# Patient Record
Sex: Female | Born: 1955
Health system: Southern US, Community
[De-identification: ages and names within clinical notes are randomized; demographics above are authoritative.]

## PROBLEM LIST (undated history)

## (undated) DIAGNOSIS — Z87898 Personal history of other specified conditions: Secondary | ICD-10-CM

## (undated) DIAGNOSIS — J45909 Unspecified asthma, uncomplicated: Secondary | ICD-10-CM

## (undated) DIAGNOSIS — Z8709 Personal history of other diseases of the respiratory system: Secondary | ICD-10-CM

## (undated) DIAGNOSIS — J189 Pneumonia, unspecified organism: Secondary | ICD-10-CM

## (undated) DIAGNOSIS — H9311 Tinnitus, right ear: Secondary | ICD-10-CM

## (undated) DIAGNOSIS — K8010 Calculus of gallbladder with chronic cholecystitis without obstruction: Secondary | ICD-10-CM

## (undated) DIAGNOSIS — I7300 Raynaud's syndrome without gangrene: Secondary | ICD-10-CM

## (undated) DIAGNOSIS — M8580 Other specified disorders of bone density and structure, unspecified site: Secondary | ICD-10-CM

## (undated) DIAGNOSIS — IMO0002 Reserved for concepts with insufficient information to code with codable children: Secondary | ICD-10-CM

## (undated) DIAGNOSIS — I10 Essential (primary) hypertension: Secondary | ICD-10-CM

## (undated) DIAGNOSIS — F329 Major depressive disorder, single episode, unspecified: Secondary | ICD-10-CM

## (undated) DIAGNOSIS — K838 Other specified diseases of biliary tract: Secondary | ICD-10-CM

## (undated) DIAGNOSIS — F32A Depression, unspecified: Secondary | ICD-10-CM

## (undated) DIAGNOSIS — R918 Other nonspecific abnormal finding of lung field: Secondary | ICD-10-CM

## (undated) HISTORY — DX: Depression, unspecified: F32.A

## (undated) HISTORY — DX: Tinnitus, right ear: H93.11

## (undated) HISTORY — DX: Reserved for concepts with insufficient information to code with codable children: IMO0002

## (undated) HISTORY — DX: Essential (primary) hypertension: I10

## (undated) HISTORY — DX: Other nonspecific abnormal finding of lung field: R91.8

## (undated) HISTORY — PX: DILATION AND CURETTAGE OF UTERUS: SHX78

## (undated) HISTORY — DX: Major depressive disorder, single episode, unspecified: F32.9

## (undated) HISTORY — PX: COLONOSCOPY: SHX174

## (undated) HISTORY — PX: CHOLECYSTECTOMY: SHX55

## (undated) HISTORY — DX: Other specified disorders of bone density and structure, unspecified site: M85.80

---

## 1992-05-01 HISTORY — PX: BREAST SURGERY: SHX581

## 1997-09-04 ENCOUNTER — Other Ambulatory Visit: Admission: RE | Admit: 1997-09-04 | Discharge: 1997-09-04 | Payer: Self-pay | Admitting: Obstetrics & Gynecology

## 1998-09-08 ENCOUNTER — Other Ambulatory Visit: Admission: RE | Admit: 1998-09-08 | Discharge: 1998-09-08 | Payer: Self-pay | Admitting: Obstetrics & Gynecology

## 2001-10-30 ENCOUNTER — Other Ambulatory Visit: Admission: RE | Admit: 2001-10-30 | Discharge: 2001-10-30 | Payer: Self-pay | Admitting: Obstetrics & Gynecology

## 2003-08-04 ENCOUNTER — Other Ambulatory Visit: Admission: RE | Admit: 2003-08-04 | Discharge: 2003-08-04 | Payer: Self-pay | Admitting: Obstetrics & Gynecology

## 2005-01-25 ENCOUNTER — Other Ambulatory Visit: Admission: RE | Admit: 2005-01-25 | Discharge: 2005-01-25 | Payer: Self-pay | Admitting: Obstetrics and Gynecology

## 2005-02-21 ENCOUNTER — Encounter (INDEPENDENT_AMBULATORY_CARE_PROVIDER_SITE_OTHER): Payer: Self-pay | Admitting: *Deleted

## 2005-02-21 ENCOUNTER — Ambulatory Visit (HOSPITAL_COMMUNITY): Admission: RE | Admit: 2005-02-21 | Discharge: 2005-02-21 | Payer: Self-pay | Admitting: Obstetrics and Gynecology

## 2009-12-08 ENCOUNTER — Ambulatory Visit (HOSPITAL_BASED_OUTPATIENT_CLINIC_OR_DEPARTMENT_OTHER): Admission: RE | Admit: 2009-12-08 | Discharge: 2009-12-08 | Payer: Self-pay | Admitting: General Surgery

## 2010-09-16 NOTE — Op Note (Signed)
NAME:  Shelby Becker, Shelby Becker NO.:  [id number]   MEDICAL RECORD NO.:  [id number]          PATIENT TYPE:  AMB   LOCATION:  SDC                           FACILITY:  [facility]   PHYSICIAN:  Randye Lobo, M.D.   DATE OF BIRTH:  December 12, 1955   DATE OF PROCEDURE:  02/21/2005  DATE OF DISCHARGE:                                 OPERATIVE REPORT   PREOPERATIVE DIAGNOSES:  1.  Postmenopausal bleeding.  2.  Thickened endometrium.  3.  Cervical stenosis.   POSTOPERATIVE DIAGNOSES:  1.  Postmenopausal bleeding.  2.  Thickened endometrium.  3.  Cervical stenosis.  4.  Asherman syndrome.   PROCEDURE:  Hysteroscopy, fractional dilatation and curettage.   SURGEON:  Conley Simmonds, M.D.   ANESTHESIA:  General endotracheal, paracervical block with 1% lidocaine.   IV FLUIDS:  __________.   ESTIMATED BLOOD LOSS:  Minimal.   URINE OUTPUT:  50 mL prior to procedure.   COMPLICATIONS:  None.   INDICATIONS FOR PROCEDURE:  The patient is a 55 year old gravida 4, para 3-0-  1-2, Caucasian female with an episode of postmenopausal bleeding.  The  patient was seen in the office and had an ultrasound on February 03, 2005,  which documented an endometrial thickness of 0.93 cm.  There was a 1.92 cm  anterior fibroid.  The ovaries were not seen well.  An attempt was made to perform an  endometrial biopsy in the office; however, there was cervical stenosis  encountered and this was not possible.  A plan was made to proceed with a  hysteroscopy, D&C, and possible polypectomy after risks, benefits, and  alternatives were discussed with the patient.   FINDINGS:  Exam under anesthesia revealed a small, midposition uterus.  No  adnexal masses were appreciated.   During the dilation process, cervical stenosis was encountered.  Hysteroscopy did demonstrate some thickened endometrium.  At the uterine  fundus, there appeared to be areas of adhesion between the anterior and the  posterior uterine wall  suggesting Asherman syndrome.   A small amount of endometrial and endocervical curettings were obtained.   SPECIMENS:  Endocervical curettings and endometrial curettings were sent to  pathology separately.   PROCEDURE:  The patient was reidentified in the preoperative hold area.  She  was taken down to the operating room, where general endotracheal anesthesia  was induced.  The patient was placed in the dorsal lithotomy position and  the vagina and perineum were then sterilely prepped and the bladder was  catheterized of urine.  The patient was then sterilely draped.   A speculum was placed inside the vagina and a single-tooth tenaculum was  placed on the anterior cervical lip.  An attempt was made to pass the  uterine sound; however, this was not possible.  Small Hegar dilators were  then used to dilate the cervix initially so that the [facility] dilators could be  used successfully up to a [employer] dilator.  The hysteroscope was then  inserted and under the continuous infusion of sorbitol solution, the  findings were as noted above.  The hysteroscope was  then withdrawn and  endocervical curettings were performed with a Kevorkian curette followed by  endometrial curettings with the serrated curette.  The specimens were sent  to pathology.  The hysteroscope was inserted one final time and the  endometrial cavity had a more uniform appearance at this time.  No specific  remaining lesions were identified.  The tubal ostia could not be visualized  well on both sides.  The hysteroscope was withdrawn and the vaginal instruments were removed.  Hemostasis was assured.   The patient was awakened and extubated and sent to the recovery room in  stable and awake condition.  There were no complications to this procedure.  All needle, instrument, and  sponge counts were correct.      Randye Lobo, M.D.  Electronically Signed     BES/MEDQ  D:  02/21/2005  T:  02/21/2005  Job:  [id number]

## 2010-10-19 ENCOUNTER — Other Ambulatory Visit: Payer: Self-pay | Admitting: Obstetrics and Gynecology

## 2013-02-13 ENCOUNTER — Other Ambulatory Visit: Payer: Self-pay | Admitting: Dermatology

## 2013-04-08 ENCOUNTER — Other Ambulatory Visit: Payer: Self-pay

## 2013-10-02 ENCOUNTER — Telehealth: Payer: Self-pay | Admitting: Obstetrics and Gynecology

## 2013-10-02 NOTE — Telephone Encounter (Signed)
Trying to confirm patients appt home number is invalid and cell the vm was not set up yet

## 2013-10-02 NOTE — Telephone Encounter (Signed)
Patient confirmed appoitment

## 2013-10-09 ENCOUNTER — Ambulatory Visit (INDEPENDENT_AMBULATORY_CARE_PROVIDER_SITE_OTHER): Payer: BC Managed Care – PPO | Admitting: Obstetrics and Gynecology

## 2013-10-09 ENCOUNTER — Encounter: Payer: Self-pay | Admitting: Obstetrics and Gynecology

## 2013-10-09 VITALS — BP 120/82 | HR 70 | Ht 67.0 in | Wt 194.6 lb

## 2013-10-09 DIAGNOSIS — Z01419 Encounter for gynecological examination (general) (routine) without abnormal findings: Secondary | ICD-10-CM

## 2013-10-09 MED ORDER — ESTRADIOL 0.1 MG/GM VA CREA: TOPICAL_CREAM | VAGINAL | Status: DC

## 2013-10-09 NOTE — Patient Instructions (Signed)

## 2013-10-09 NOTE — Progress Notes (Signed)
Patient ID: Shelby Becker, female   DOB: 1956-01-25, 58 y.o.   MRN: [id number] GYNECOLOGY VISIT  PCP:   Hulan Fess, MD  Referring provider:   HPI: 58 y.o.   Single  Caucasian  female   [phone] with Patient's last menstrual period was 05/01/2004.   here for  AEX.    Needs refill on Estrace cream.  Uses rarely.   Has learned that dietary choices affect her urination and pain with urination.  No dysuria today.  Some urinary frequency which irritates her when she wants to go on long walk.  Voids often for convenience.   Children out of the house.  Husband has retired.   Goal to loose 15 pounds. Has already lost 5 pounds. Does 4 mile walks.  Hairline fracture of left foot.   Hgb:   PCP Urine:  PCP  GYNECOLOGIC HISTORY: Patient's last menstrual period was 05/01/2004. Sexually active:  yes Partner preference: female Contraception:  Postmenopausal  Menopausal hormone therapy: Estrace vaginal cream prn DES exposure:  no  Blood transfusions:  no  Sexually transmitted diseases: no   GYN procedures and prior surgeries:  C-section, benign breast biopsy on left breast. Last mammogram:  09/2012 GLO:VFIEP               Last pap and high risk HPV testing: 09/2011 PIR:JJOACZ of HPV testing.   History of abnormal pap smear: 1986 had colposcopy/cryotherapy to cervix for abnormal pap.  Paps normal since.    OB History   Grav Para Term Preterm Abortions TAB SAB Ect Mult Living   3 3 3       2        LIFESTYLE: Exercise:  walking             Tobacco:  no Alcohol:    2-3 drinks per week--usually wine Drug use:  no  OTHER HEALTH MAINTENANCE: Tetanus/TDap:  Up to date with PCP Gardisil:             n/a Influenza:          01/2013 Zostavax:           23-Aug-2013 with PCP  Bone density:    08-24-2010 at [facility] Colonoscopy:   2008-08-23 wnl with Dr. Cristina Gong.  Next colonoscopy due 2018-08-24.  Cholesterol check: 08/2013 wnl  Family History  Problem Relation Age of Onset  . Thyroid disease  Mother     hypothyroid  . Hypertension Father   . Thyroid disease Sister     Hashimotos    There are no active problems to display for this patient.  Past Medical History  Diagnosis Date  . Depression     Tx'd in August 23, 1989 after a death of an infant  . Dyspareunia   . Hypertension     Past Surgical History  Procedure Laterality Date  . Breast surgery  1994    benign cyst removed left breast  . Cesarean section  1991    ALLERGIES: Review of patient's allergies indicates no known allergies.  Current Outpatient Prescriptions  Medication Sig Dispense Refill  . CALCIUM PO Take 400 mg by mouth daily.      . Cholecalciferol (VITAMIN D-3) 1000 UNITS CAPS Take 1,000 Units by mouth daily.      Marland Kitchen estradiol (ESTRACE VAGINAL) 0.1 MG/GM vaginal cream Place 1 Applicatorful vaginally as needed.      Marland Kitchen losartan (COZAAR) 50 MG tablet Take 1 tablet by mouth daily.      . meloxicam (MOBIC)  15 MG tablet Take 15 mg by mouth as needed.      . vitamin B-12 (CYANOCOBALAMIN) 1000 MCG tablet Take 1,000 mcg by mouth once a week.       No current facility-administered medications for this visit.     ROS:  Pertinent items are noted in HPI.  SOCIAL HISTORY:  Married.   PHYSICAL EXAMINATION:    BP 120/82  Pulse 70  Ht 5\' 7"  (1.702 m)  Wt 194 lb 9.6 oz (88.27 kg)  BMI 30.47 kg/m2  LMP 05/01/2004   Wt Readings from Last 3 Encounters:  10/09/13 194 lb 9.6 oz (88.27 kg)     Ht Readings from Last 3 Encounters:  10/09/13 5\' 7"  (1.702 m)    General appearance: alert, cooperative and appears stated age Head: Normocephalic, without obvious abnormality, atraumatic Neck: no adenopathy, supple, symmetrical, trachea midline and thyroid not enlarged, symmetric, no tenderness/mass/nodules Lungs: clear to auscultation bilaterally Breasts: Inspection negative, No nipple retraction or dimpling, No nipple discharge or bleeding, No axillary or supraclavicular adenopathy, Normal to palpation without dominant  masses Heart: regular rate and rhythm Abdomen: soft, non-tender; no masses,  no organomegaly Extremities: extremities normal, atraumatic, no cyanosis or edema Skin: Skin color, texture, turgor normal. No rashes or lesions Lymph nodes: Cervical, supraclavicular, and axillary nodes normal. No abnormal inguinal nodes palpated Neurologic: Grossly normal  Pelvic: External genitalia:  no lesions              Urethra:  normal appearing urethra with no masses, tenderness or lesions              Bartholins and Skenes: normal                 Vagina: normal appearing vagina with normal color and discharge, no lesions              Cervix: normal appearance              Pap and high risk HPV testing done: yes.            Bimanual Exam:  Uterus:  uterus is normal size, shape, consistency and nontender                                      Adnexa: normal adnexa in size, nontender and no masses                                      Rectovaginal: Confirms                                      Anus:  normal sphincter tone, no lesions  ASSESSMENT  Normal gynecologic exam. Remote history of cervical dysplasia.  Vaginal atrophy.   PLAN  Mammogram recommended yearly.  She will call [facility].  Pap smear and high risk HPV testing performed.  Estrace cream 1/2 gm pv at hs twice a week prn.  See Epic orders. Discussed risks of breat CA, MI, stroke , DVT, PE.  Counseled on self breast exam, Calcium and vitamin D intake, exercise. Return annually or prn   An After Visit Summary was printed and given to the patient.

## 2013-10-10 NOTE — Addendum Note (Signed)
Addended by: Tacy Learn, BROOK E on: 10/10/2013 06:12 PM   Modules accepted: Orders

## 2013-10-16 LAB — IPS PAP TEST WITH HPV

## 2013-11-24 ENCOUNTER — Other Ambulatory Visit: Payer: Self-pay | Admitting: Dermatology

## 2013-12-26 ENCOUNTER — Encounter: Payer: Self-pay | Admitting: Obstetrics and Gynecology

## 2014-03-02 ENCOUNTER — Encounter: Payer: Self-pay | Admitting: Obstetrics and Gynecology

## 2014-08-10 ENCOUNTER — Other Ambulatory Visit: Payer: Self-pay | Admitting: Dermatology

## 2014-08-17 ENCOUNTER — Telehealth: Payer: Self-pay | Admitting: Obstetrics and Gynecology

## 2014-08-17 NOTE — Telephone Encounter (Signed)
Left patient a message to call back to reschedule a future appointment that was cancelled by the provider. °

## 2014-10-16 ENCOUNTER — Ambulatory Visit: Payer: Self-pay | Admitting: Obstetrics and Gynecology

## 2014-10-30 ENCOUNTER — Ambulatory Visit: Payer: Self-pay | Admitting: Obstetrics and Gynecology

## 2014-11-04 ENCOUNTER — Ambulatory Visit (INDEPENDENT_AMBULATORY_CARE_PROVIDER_SITE_OTHER): Payer: BC Managed Care – PPO | Admitting: Obstetrics and Gynecology

## 2014-11-04 ENCOUNTER — Encounter: Payer: Self-pay | Admitting: Obstetrics and Gynecology

## 2014-11-04 VITALS — BP 118/78 | HR 66 | Resp 18 | Ht 67.5 in | Wt 195.2 lb

## 2014-11-04 DIAGNOSIS — Z0000 Encounter for general adult medical examination without abnormal findings: Secondary | ICD-10-CM | POA: Diagnosis not present

## 2014-11-04 DIAGNOSIS — Z01419 Encounter for gynecological examination (general) (routine) without abnormal findings: Secondary | ICD-10-CM

## 2014-11-04 LAB — POCT URINALYSIS DIPSTICK
Bilirubin, UA: NEGATIVE
Blood, UA: NEGATIVE
Glucose, UA: NEGATIVE
Ketones, UA: NEGATIVE
Leukocytes, UA: NEGATIVE
Nitrite, UA: NEGATIVE
Protein, UA: NEGATIVE
Urobilinogen, UA: NEGATIVE
pH, UA: 5

## 2014-11-04 NOTE — Patient Instructions (Signed)

## 2014-11-04 NOTE — Progress Notes (Signed)
Patient ID: Shelby Becker, female   DOB: 16-Sep-1955, 59 y.o.   MRN: [id number] 59 y.o. [id number] Single Caucasian female here for annual exam.    Has Rx for vaginal Estrace cream.  Not using lately.  Not sexually active and uses samples from a urology friend.   Doing slow and gradual weight loss.  Having right bicep problems.  May need future surgery.   Husband has retired now.  Parents are 39 and living on their own. Daughter going to grad school.  PCP:  Hulan Fess, MD   Labs with PCP. Had low GFR. Patient increased her antiHTN and she increased her fluid intact.  Some dizziness and fatigue.   Patient's last menstrual period was 05/01/2004.          Sexually active: No.  The current method of family planning is post menopausal status.    Exercising: Yes.    walking and jogging. Smoker:  former  Health Maintenance: Pap:  10-09-13 wnl:neg HR HPV History of abnormal Pap:  Yes, Hx colposcopy and cryotherapy to cervix 1986. MMG:  11-08-13 Density Cat.B/Neg:[facility].  Has an appointment for next week. Colonoscopy:  2008/08/19 normal with Dr. Cristina Gong.  Next due 08-20-18. BMD:   08/20/2010  Result  Normal:[facility] TDaP:  Up to date with PCP Screening Labs:  Hb today: PCP, Urine today: Neg   reports that she quit smoking about 30 years ago. She does not have any smokeless tobacco history on file. She reports that she drinks about 0.6 oz of alcohol per week. She reports that she does not use illicit drugs.  Past Medical History  Diagnosis Date  . Depression     Tx'd in 08/19/89 after a death of an infant  . Dyspareunia   . Hypertension     Past Surgical History  Procedure Laterality Date  . Breast surgery  1994    benign cyst removed left breast  . Cesarean section  08-19-1989    Current Outpatient Prescriptions  Medication Sig Dispense Refill  . CALCIUM PO Take 400 mg by mouth daily.    . Cholecalciferol (VITAMIN D-3) 1000 UNITS CAPS Take 1,000 Units by mouth daily. Patient taking 2000 units  daily    . ergocalciferol (VITAMIN D2) 50000 UNITS capsule Take 50,000 Units by mouth once a week.    . estradiol (ESTRACE VAGINAL) 0.1 MG/GM vaginal cream Use 1/2 gram per vagina twice a week as needed. 42.5 g 3  . losartan (COZAAR) 50 MG tablet Take 1 tablet by mouth daily.     No current facility-administered medications for this visit.    Family History  Problem Relation Age of Onset  . Thyroid disease Mother     hypothyroid  . Hypertension Father   . Thyroid disease Sister     Hashimotos    ROS:  Pertinent items are noted in HPI.  Otherwise, a comprehensive ROS was negative.  Exam:   BP 118/78 mmHg  Pulse 66  Resp 18  Ht 5' 7.5" (1.715 m)  Wt 195 lb 3.2 oz (88.542 kg)  BMI 30.10 kg/m2  LMP 05/01/2004    General appearance: alert, cooperative and appears stated age Head: Normocephalic, without obvious abnormality, atraumatic Neck: no adenopathy, supple, symmetrical, trachea midline and thyroid normal to inspection and palpation Lungs: clear to auscultation bilaterally Breasts: normal appearance, no masses or tenderness, Inspection negative, No nipple retraction or dimpling, No nipple discharge or bleeding, No axillary or supraclavicular adenopathy, Radial scar of right breast. Heart: regular rate  and rhythm Abdomen: soft, non-tender; bowel sounds normal; no masses,  no organomegaly Extremities: extremities normal, atraumatic, no cyanosis or edema Skin: Skin color, texture, turgor normal. No rashes or lesions Lymph nodes: Cervical, supraclavicular, and axillary nodes normal. No abnormal inguinal nodes palpated Neurologic: Grossly normal  Pelvic: External genitalia:  no lesions.  Pigmentation of right labia minora.                Urethra:  normal appearing urethra with no masses, tenderness or lesions              Bartholins and Skenes: normal                 Vagina: normal appearing vagina with normal color and discharge, no lesions.  Atrophy of the vaginal mucosa.                Cervix: no lesions              Pap taken: No. Bimanual Exam:  Uterus:  normal size, contour, position, consistency, mobility, non-tender              Adnexa: normal adnexa and no mass, fullness, tenderness              Rectovaginal: Yes.  .  Confirms.              Anus:  normal sphincter tone, no lesions  Chaperone was present for exam.  Assessment:   Well woman visit with normal exam. Periodic use of local vaginal estrogen therapy.   Plan: Yearly mammogram recommended after age 66.  Recommended self breast exam.  Pap and HR HPV as above. Discussed Calcium, Vitamin D, regular exercise program including cardiovascular and weight bearing exercise. Labs performed.  No..   See orders. Refills given on medications.  No..    Discussed use of vaginal estrogen cream 1/2 gram pv twice weekly.  Discussed risks of breast CA, DVT, PE, MI and stroke.  Follow up annually and prn.     After visit summary provided.

## 2014-12-19 ENCOUNTER — Encounter (HOSPITAL_COMMUNITY): Payer: Self-pay | Admitting: *Deleted

## 2014-12-19 ENCOUNTER — Emergency Department (HOSPITAL_COMMUNITY): Payer: BC Managed Care – PPO

## 2014-12-19 ENCOUNTER — Emergency Department (HOSPITAL_COMMUNITY)
Admission: EM | Admit: 2014-12-19 | Discharge: 2014-12-19 | Disposition: A | Discharge status: Home / Self Care | Payer: BC Managed Care – PPO | Attending: Emergency Medicine | Admitting: Emergency Medicine

## 2014-12-19 DIAGNOSIS — Z87448 Personal history of other diseases of urinary system: Secondary | ICD-10-CM | POA: Diagnosis not present

## 2014-12-19 DIAGNOSIS — Z79899 Other long term (current) drug therapy: Secondary | ICD-10-CM | POA: Insufficient documentation

## 2014-12-19 DIAGNOSIS — R1011 Right upper quadrant pain: Secondary | ICD-10-CM | POA: Diagnosis present

## 2014-12-19 DIAGNOSIS — I10 Essential (primary) hypertension: Secondary | ICD-10-CM | POA: Diagnosis not present

## 2014-12-19 DIAGNOSIS — Z87891 Personal history of nicotine dependence: Secondary | ICD-10-CM | POA: Diagnosis not present

## 2014-12-19 DIAGNOSIS — K8070 Calculus of gallbladder and bile duct without cholecystitis without obstruction: Secondary | ICD-10-CM | POA: Diagnosis not present

## 2014-12-19 DIAGNOSIS — Z8659 Personal history of other mental and behavioral disorders: Secondary | ICD-10-CM | POA: Diagnosis not present

## 2014-12-19 LAB — URINALYSIS, ROUTINE W REFLEX MICROSCOPIC
Bilirubin Urine: NEGATIVE
Glucose, UA: NEGATIVE mg/dL
Hgb urine dipstick: NEGATIVE
Ketones, ur: NEGATIVE mg/dL
Leukocytes, UA: NEGATIVE
Nitrite: NEGATIVE
Protein, ur: NEGATIVE mg/dL
Specific Gravity, Urine: 1.013 (ref 1.005–1.030)
Urobilinogen, UA: 0.2 mg/dL (ref 0.0–1.0)
pH: 5 (ref 5.0–8.0)

## 2014-12-19 LAB — COMPREHENSIVE METABOLIC PANEL
ALT: 24 U/L (ref 14–54)
AST: 24 U/L (ref 15–41)
Albumin: 4.2 g/dL (ref 3.5–5.0)
Alkaline Phosphatase: 95 U/L (ref 38–126)
Anion gap: 10 (ref 5–15)
BUN: 16 mg/dL (ref 6–20)
CO2: 22 mmol/L (ref 22–32)
Calcium: 9.6 mg/dL (ref 8.9–10.3)
Chloride: 109 mmol/L (ref 101–111)
Creatinine, Ser: 1.08 mg/dL — ABNORMAL HIGH (ref 0.44–1.00)
GFR calc Af Amer: >60 mL/min (ref >60)
GFR calc non Af Amer: 55 mL/min — ABNORMAL LOW (ref >60)
Glucose, Bld: 129 mg/dL — ABNORMAL HIGH (ref 65–99)
Potassium: 4 mmol/L (ref 3.5–5.1)
Sodium: 141 mmol/L (ref 135–145)
Total Bilirubin: 0.7 mg/dL (ref 0.3–1.2)
Total Protein: 6.9 g/dL (ref 6.5–8.1)

## 2014-12-19 LAB — CBC
HCT: 43.4 % (ref 36.0–46.0)
Hemoglobin: 14.4 g/dL (ref 12.0–15.0)
MCH: 30.4 pg (ref 26.0–34.0)
MCHC: 33.2 g/dL (ref 30.0–36.0)
MCV: 91.8 fL (ref 78.0–100.0)
Platelets: 185 10*3/uL (ref 150–400)
RBC: 4.73 MIL/uL (ref 3.87–5.11)
RDW: 14 % (ref 11.5–15.5)
WBC: 7.6 10*3/uL (ref 4.0–10.5)

## 2014-12-19 LAB — LIPASE, BLOOD: Lipase: 22 U/L (ref 22–51)

## 2014-12-19 LAB — TROPONIN I: Troponin I: <0.03 ng/mL (ref <0.031)

## 2014-12-19 MED ORDER — HYDROCODONE-ACETAMINOPHEN 5-325 MG PO TABS: 1.0000 | ORAL_TABLET | Freq: Four times a day (QID) | ORAL | Status: DC | PRN

## 2014-12-19 MED ORDER — MORPHINE SULFATE (PF) 4 MG/ML IV SOLN
4.0000 mg | Freq: Once | INTRAVENOUS | Status: AC
  Administered 2014-12-19: 4 mg via INTRAVENOUS
  Filled 2014-12-19: qty 1

## 2014-12-19 MED ORDER — ONDANSETRON HCL 4 MG/2ML IJ SOLN
4.0000 mg | Freq: Once | INTRAMUSCULAR | Status: AC
  Administered 2014-12-19: 4 mg via INTRAVENOUS
  Filled 2014-12-19: qty 2

## 2014-12-19 NOTE — Discharge Instructions (Signed)
Biliary Colic  °Biliary colic is a steady or irregular pain in the upper abdomen. It is usually under the right side of the rib cage. It happens when gallstones interfere with the normal flow of bile from the gallbladder. Bile is a liquid that helps to digest fats. Bile is made in the liver and stored in the gallbladder. When you eat a meal, bile passes from the gallbladder through the cystic duct and the common bile duct into the small intestine. There, it mixes with partially digested food. If a gallstone blocks either of these ducts, the normal flow of bile is blocked. The muscle cells in the bile duct contract forcefully to try to move the stone. This causes the pain of biliary colic.  °SYMPTOMS  °· A person with biliary colic usually complains of pain in the upper abdomen. This pain can be: °¨ In the center of the upper abdomen just below the breastbone. °¨ In the upper-right part of the abdomen, near the gallbladder and liver. °¨ Spread back toward the right shoulder blade. °· Nausea and vomiting. °· The pain usually occurs after eating. °· Biliary colic is usually triggered by the digestive system's demand for bile. The demand for bile is high after fatty meals. Symptoms can also occur when a person who has been fasting suddenly eats a very large meal. Most episodes of biliary colic pass after 1 to 5 hours. After the most intense pain passes, your abdomen may continue to ache mildly for about 24 hours. °DIAGNOSIS  °After you describe your symptoms, your caregiver will perform a physical exam. He or she will pay attention to the upper right portion of your belly (abdomen). This is the area of your liver and gallbladder. An ultrasound will help your caregiver look for gallstones. Specialized scans of the gallbladder may also be done. Blood tests may be done, especially if you have fever or if your pain persists. °PREVENTION  °Biliary colic can be prevented by controlling the risk factors for gallstones. Some of  these risk factors, such as heredity, increasing age, and pregnancy are a normal part of life. Obesity and a high-fat diet are risk factors you can change through a healthy lifestyle. Women going through menopause who take hormone replacement therapy (estrogen) are also more likely to develop biliary colic. °TREATMENT  °· Pain medication may be prescribed. °· You may be encouraged to eat a fat-free diet. °· If the first episode of biliary colic is severe, or episodes of colic keep retuning, surgery to remove the gallbladder (cholecystectomy) is usually recommended. This procedure can be done through small incisions using an instrument called a laparoscope. The procedure often requires a brief stay in the hospital. Some people can leave the hospital the same day. It is the most widely used treatment in people troubled by painful gallstones. It is effective and safe, with no complications in more than 90% of cases. °· If surgery cannot be done, medication that dissolves gallstones may be used. This medication is expensive and can take months or years to work. Only small stones will dissolve. °· Rarely, medication to dissolve gallstones is combined with a procedure called shock-wave lithotripsy. This procedure uses carefully aimed shock waves to break up gallstones. In many people treated with this procedure, gallstones form again within a few years. °PROGNOSIS  °If gallstones block your cystic duct or common bile duct, you are at risk for repeated episodes of biliary colic. There is also a 25% chance that you will develop   a gallbladder infection(acute cholecystitis), or some other complication of gallstones within 10 to 20 years. If you have surgery, schedule it at a time that is convenient for you and at a time when you are not sick. °HOME CARE INSTRUCTIONS  °· Drink plenty of clear fluids. °· Avoid fatty, greasy or fried foods, or any foods that make your pain worse. °· Take medications as directed. °SEEK MEDICAL  CARE IF:  °· You develop a fever over 100.5° F (38.1° C). °· Your pain gets worse over time. °· You develop nausea that prevents you from eating and drinking. °· You develop vomiting. °SEEK IMMEDIATE MEDICAL CARE IF:  °· You have continuous or severe belly (abdominal) pain which is not relieved with medications. °· You develop nausea and vomiting which is not relieved with medications. °· You have symptoms of biliary colic and you suddenly develop a fever and shaking chills. This may signal cholecystitis. Call your caregiver immediately. °· You develop a yellow color to your skin or the white part of your eyes (jaundice). °Document Released: 09/18/2005 Document Revised: 07/10/2011 Document Reviewed: 11/28/2007 °Exit[employer]® Patient Information ©2015 [employer]. This information is not intended to replace advice given to you by your health care provider. Make sure you discuss any questions you have with your health care provider. ° °

## 2014-12-19 NOTE — ED Notes (Signed)
Nurse getting labs 

## 2014-12-19 NOTE — ED Notes (Signed)
Pt cannot use restroom at this time, aware specimen is needed. 

## 2014-12-19 NOTE — ED Provider Notes (Signed)
CSN: [id number]     Arrival date & time 12/19/14  1524 History   First MD Initiated Contact with Patient 12/19/14 1545     Chief Complaint  Patient presents with  . Abdominal Pain     (Consider location/radiation/quality/duration/timing/severity/associated sxs/prior Treatment) HPI Comments: Pt comes in with c/o epigastric pain for the last 3 hours. She states that after 2 dose of tums she got some relief. Denies vomiting and fever. Has nausea. Has watery stool yesterday. She states that she had a similar episode about a month ago but it resolved so she was not seen. She states that she had an argument with family member prior to episode. She states that she is still having some discomfort.   The history is provided by the patient. No language interpreter was used.    Past Medical History  Diagnosis Date  . Depression     Tx'd in Jul 31, 1989 after a death of an infant  . Dyspareunia   . Hypertension    Past Surgical History  Procedure Laterality Date  . Breast surgery  1994    benign cyst removed left breast  . Cesarean section  07-31-89   Family History  Problem Relation Age of Onset  . Thyroid disease Mother     hypothyroid  . Hypertension Father   . Thyroid disease Sister     [place]   Social History  Substance Use Topics  . Smoking status: Former Smoker    Quit date: 05/01/1984  . Smokeless tobacco: None  . Alcohol Use: 0.6 oz/week    1 Standard drinks or equivalent per week     Comment: once weekly   OB History    Gravida Para Term Preterm AB TAB SAB Ectopic Multiple Living   3 3 3       2      Review of Systems  All other systems reviewed and are negative.     Allergies  Review of patient's allergies indicates no known allergies.  Home Medications   Prior to Admission medications   Medication Sig Start Date End Date Taking? Authorizing Provider  CALCIUM PO Take 400 mg by mouth daily.    Historical Provider, MD  Cholecalciferol (VITAMIN D-3) 1000 UNITS CAPS  Take 1,000 Units by mouth daily. Patient taking 2000 units daily    Historical Provider, MD  ergocalciferol (VITAMIN D2) 50000 UNITS capsule Take 50,000 Units by mouth once a week.    Historical Provider, MD  estradiol (ESTRACE VAGINAL) 0.1 MG/GM vaginal cream Use 1/2 gram per vagina twice a week as needed. 10/09/13   Brook Oletta Lamas, MD  losartan (COZAAR) 50 MG tablet Take 1 tablet by mouth daily. 09/03/13   Historical Provider, MD   BP 133/83 mmHg  Pulse 76  Temp(Src) 98.7 F (37.1 C) (Oral)  Resp 18  SpO2 94%  LMP 05/01/2004 Physical Exam  Constitutional: She is oriented to person, place, and time. She appears well-developed and well-nourished.  HENT:  Head: Normocephalic and atraumatic.  Eyes: Conjunctivae and EOM are normal.  Cardiovascular: Normal rate and regular rhythm.   Pulmonary/Chest: Effort normal and breath sounds normal.  Abdominal: Soft. Bowel sounds are normal. There is tenderness in the right upper quadrant.  Musculoskeletal: Normal range of motion.  Neurological: She is alert and oriented to person, place, and time.  Skin: Skin is warm and dry.  Psychiatric: She has a normal mood and affect.  Nursing note and vitals reviewed.   ED Course  Procedures (including  critical care time) Labs Review Labs Reviewed  COMPREHENSIVE METABOLIC PANEL - Abnormal; Notable for the following:    Glucose, Bld 129 (*)    Creatinine, Ser 1.08 (*)    GFR calc non Af Amer 55 (*)    All other components within normal limits  URINALYSIS, ROUTINE W REFLEX MICROSCOPIC (NOT AT [facility]) - Abnormal; Notable for the following:    APPearance CLOUDY (*)    All other components within normal limits  LIPASE, BLOOD  CBC  TROPONIN I    Imaging Review Dg Chest 2 View  12/19/2014   CLINICAL DATA:  Right upper quadrant pain, tenderness, and nausea for 1 day.  EXAM: CHEST  2 VIEW  COMPARISON:  None.  FINDINGS: The heart size and mediastinal contours are within normal limits. Both lungs are  clear. The visualized skeletal structures are unremarkable.  IMPRESSION: No active cardiopulmonary disease.   Electronically Signed   By: Earle Gell M.D.   On: 12/19/2014 16:31   I have personally reviewed and evaluated these images and lab results as part of my medical decision-making.  ED ECG REPORT   Date: 12/19/2014  Rate: 64  Rhythm: normal sinus rhythm  QRS Axis: normal  Intervals: normal  ST/T Wave abnormalities: normal  Conduction Disutrbances:none  Narrative Interpretation:   Old EKG Reviewed: none available  I have personally reviewed the EKG tracing and agree with the computerized printout as noted.   MDM   Final diagnoses:  Calculus of gallbladder and bile duct without cholecystitis or obstruction    Pt is comfortable at this time. Pt is okay to follow up with surgery. Doubt cardiac in nature. Will send home with hydrocodone for pain    Glendell Docker, NP 12/19/14 1841  Varney Biles, MD 12/21/14 [phone]

## 2014-12-19 NOTE — ED Notes (Signed)
Pt reports waxing and waning upper abd pain x3 hours. Sudden onset. Pain worse when lying flat. +nausea, no vomiting. One loose, watery BM yesterday, no associated symptoms then. Denies fever/chills/diaphoresis.

## 2015-01-13 ENCOUNTER — Other Ambulatory Visit: Payer: Self-pay | Admitting: Surgery

## 2015-01-13 NOTE — H&P (Signed)
Shelby Becker 01/13/2015 10:02 AM Location: [facility] Patient #: [id number] DOB: Mar 21, 1956 Married / Language: English / Race: White Female History of Present Illness Adin Hector MD; 01/13/2015 1:27 PM) The patient is a 59 year old female who presents for evaluation of gall stones. Patient sent by Glendell Docker, NP with  emergency Department for concerns of epigastric pain and gallstones. Probable cholecystitis. Movements intermittent attacks of epigastric abdominal pain. Bloating. Some nausea as well. Radiation to the back. Happening in the summertime. Had an intense attack on August 20. Lasted 5 hours Went to emergency room. No cardiac etiology. EKG okay. LFTs and chemistries okay. Ultrasound did show gallstones but no cholecystitis. Pain improved with nausea and pain medicines. Surgical consultation recommended.  The patient comes in today with her husband. She describes a few attacks of sharp sudden upper abdominal pain. One lasted 2 hours and eventually improved. The second one lasted 5 hours. That's when she went to the emergency room. Some question history of heartburn in the past. Required to takes Nexium for a few weeks. Then it resolved. No issues for the past decade. With last attack she did tried some Tums and Rolaids without any help. Very bloated but cannot belch or vomit. Some nausea as well. Some radiation to her back. Seemed very central. Not particularly one side. She does get colonoscopies by Dr. Cristina Gong, [facility] - normal. She exercises regularly and has intentionally lost about 20 pounds in the past few months. She had a C-section but no other abdominal surgeries. No history of Crohn's, inflammatory bowel disease, ulcerative colitis, irritable bowel, hepatitis, heavy drinking, gastritis, ulcers, kidney stones, kidney issues. She recalls being told that her creatinine was mildly elevated. She's going to see a nephrologist for  this. Creatinine 1.08. She wonders if it's related to her exercising. PACS Images Show images for US Abdomen Complete <[email or web address]> Study Result CLINICAL DATA: Epigastric and right upper quadrant pain since noon today EXAM: ULTRASOUND ABDOMEN COMPLETE COMPARISON: None. FINDINGS: Gallbladder: Gallstones identified within the gallbladder. No wall thickening visualized. No sonographic Murphy sign noted. Common bile duct: Diameter: 4 mm Liver: No focal lesion identified. Within normal limits in parenchymal echogenicity. IVC: No abnormality visualized. Pancreas: Visualized portion unremarkable. Spleen: Size and appearance within normal limits. Right Kidney: Length: 10 cm. Echogenicity within normal limits. No mass or hydronephrosis visualized. Left Kidney: Length: 10.4 cm. Echogenicity within normal limits. No mass or hydronephrosis visualized. Abdominal aorta: No aneurysm visualized. Other findings: None. IMPRESSION: Cholelithiasis without sonographic evidence of acute cholecystitis. Electronically Signed By: Abelardo Diesel M.D. On: 12/19/2014 17:20 Other Problems Elbert Ewings, CMA; 01/13/2015 10:02 AM) Cholelithiasis Gastroesophageal Reflux Disease High blood pressure  Past Surgical History Elbert Ewings, CMA; 01/13/2015 10:02 AM) Breast Biopsy Right. Cesarean Section - 1  Diagnostic Studies History Elbert Ewings, CMA; 01/13/2015 10:02 AM) Colonoscopy 1-5 years ago Mammogram within last year Pap Smear 1-5 years ago  Allergies Elbert Ewings, CMA; 01/13/2015 10:03 AM) No Known Drug Allergies 01/13/2015  Medication History Elbert Ewings, CMA; 01/13/2015 10:04 AM) Hydrocodone-Acetaminophen (5-325MG  Tablet, Oral as needed) Active. Losartan Potassium (100MG  Tablet, Oral) Active. EpiPen 2-Pak (0.3MG /0.3ML Soln Auto-inj, Injection) Active. Vitamin D (Ergocalciferol) (50000UNIT Capsule, Oral) Active. Emollient (External) Active. Medications  Reconciled  Social History Elbert Ewings, CMA; 01/13/2015 10:02 AM) Alcohol use Occasional alcohol use. Caffeine use Coffee. No drug use Tobacco use Former smoker.  Family History Elbert Ewings, [place]; 01/13/2015 10:02 AM) Arthritis Father. Heart Disease Sister. Heart disease in female family member before age  33 Hypertension Father. Melanoma Father. Respiratory Condition Father. Thyroid problems Mother.  Pregnancy / Birth History Elbert Ewings, CMA; 01/13/2015 10:02 AM) Age at menarche 50 years. Age of menopause 62-55 Gravida 3 Maternal age 22-25 Para 3     Review of Systems Elbert Ewings CMA; 01/13/2015 10:02 AM) General Not Present- Appetite Loss, Chills, Fatigue, Fever, Night Sweats, Weight Gain and Weight Loss. Skin Not Present- Change in Wart/Mole, Dryness, Hives, Jaundice, New Lesions, Non-Healing Wounds, Rash and Ulcer. HEENT Present- Wears glasses/contact lenses. Not Present- Earache, Hearing Loss, Hoarseness, Nose Bleed, Oral Ulcers, Ringing in the Ears, Seasonal Allergies, Sinus Pain, Sore Throat, Visual Disturbances and Yellow Eyes. Breast Not Present- Breast Mass, Breast Pain, Nipple Discharge and Skin Changes. Cardiovascular Not Present- Chest Pain, Difficulty Breathing Lying Down, Leg Cramps, Palpitations, Rapid Heart Rate, Shortness of Breath and Swelling of Extremities. Gastrointestinal Not Present- Abdominal Pain, Bloating, Bloody Stool, Change in Bowel Habits, Chronic diarrhea, Constipation, Difficulty Swallowing, Excessive gas, Gets full quickly at meals, Hemorrhoids, Indigestion, Nausea, Rectal Pain and Vomiting. Female Genitourinary Not Present- Frequency, Nocturia, Painful Urination, Pelvic Pain and Urgency. Musculoskeletal Not Present- Back Pain, Joint Pain, Joint Stiffness, Muscle Pain, Muscle Weakness and Swelling of Extremities. Neurological Not Present- Decreased Memory, Fainting, Headaches, Numbness, Seizures, Tingling, Tremor, Trouble walking  and Weakness. Psychiatric Not Present- Anxiety, Bipolar, Change in Sleep Pattern, Depression, Fearful and Frequent crying. Endocrine Not Present- Cold Intolerance, Excessive Hunger, Hair Changes, Heat Intolerance, Hot flashes and New Diabetes. Hematology Not Present- Easy Bruising, Excessive bleeding, Gland problems, HIV and Persistent Infections.  Vitals Elbert Ewings CMA; 01/13/2015 10:04 AM) 01/13/2015 10:04 AM Weight: 186 lb Height: 68in Body Surface Area: 2.01 m Body Mass Index: 28.28 kg/m Temp.: 98.2F(Temporal)  Pulse: 87 (Regular)  BP: 128/78 (Sitting, Left Arm, Standard)     Physical Exam Adin Hector MD; 01/13/2015 10:31 AM)  General Mental Status-Alert. General Appearance-Not in acute distress, Not Sickly. Orientation-Oriented X3. Hydration-Well hydrated. Voice-Normal.  Integumentary Global Assessment Upon inspection and palpation of skin surfaces of the - Axillae: non-tender, no inflammation or ulceration, no drainage. and Distribution of scalp and body hair is normal. General Characteristics Temperature - normal warmth is noted.  Head and Neck Head-normocephalic, atraumatic with no lesions or palpable masses. Face Global Assessment - atraumatic, no absence of expression. Neck Global Assessment - no abnormal movements, no bruit auscultated on the right, no bruit auscultated on the left, no decreased range of motion, non-tender. Trachea-midline. Thyroid Gland Characteristics - non-tender.  Eye Eyeball - Left-Extraocular movements intact, No Nystagmus. Eyeball - Right-Extraocular movements intact, No Nystagmus. Cornea - Left-No Hazy. Cornea - Right-No Hazy. Sclera/Conjunctiva - Left-No scleral icterus, No Discharge. Sclera/Conjunctiva - Right-No scleral icterus, No Discharge. Pupil - Left-Direct reaction to light normal. Pupil - Right-Direct reaction to light normal.  ENMT Ears Pinna - Left - no drainage  observed, no generalized tenderness observed. Right - no drainage observed, no generalized tenderness observed. Nose and Sinuses External Inspection of the Nose - no destructive lesion observed. Inspection of the nares - Left - quiet respiration. Right - quiet respiration. Mouth and Throat Lips - Upper Lip - no fissures observed, no pallor noted. Lower Lip - no fissures observed, no pallor noted. Nasopharynx - no discharge present. Oral Cavity/Oropharynx - Tongue - no dryness observed. Oral Mucosa - no cyanosis observed. Hypopharynx - no evidence of airway distress observed.  Chest and Lung Exam Inspection Movements - Normal and Symmetrical. Accessory muscles - No use of accessory muscles in breathing. Palpation Palpation of  the chest reveals - Non-tender. Auscultation Breath sounds - Normal and Clear.  Cardiovascular Auscultation Rhythm - Regular. Murmurs & Other Heart Sounds - Auscultation of the heart reveals - No Murmurs and No Systolic Clicks.  Abdomen Inspection Inspection of the abdomen reveals - No Visible peristalsis and No Abnormal pulsations. Umbilicus - No Bleeding, No Urine drainage. Palpation/Percussion Palpation and Percussion of the abdomen reveal - Soft, Non Tender, No Rebound tenderness, No Rigidity (guarding) and No Cutaneous hyperesthesia. Note: Soft and flat. No guarding. No tenderness. No diastases. No umbilical hernia.   Female Genitourinary Sexual Maturity Tanner 5 - Adult hair pattern. Note: No vaginal bleeding nor discharge   Peripheral Vascular Upper Extremity Inspection - Left - No Cyanotic nailbeds, Not Ischemic. Right - No Cyanotic nailbeds, Not Ischemic.  Neurologic Neurologic evaluation reveals -normal attention span and ability to concentrate, able to name objects and repeat phrases. Appropriate fund of knowledge , normal sensation and normal coordination. Mental Status Affect - not angry, not paranoid. Cranial Nerves-Normal  Bilaterally. Gait-Normal.  Neuropsychiatric Mental status exam performed with findings of-able to articulate well with normal speech/language, rate, volume and coherence, thought content normal with ability to perform basic computations and apply abstract reasoning and no evidence of hallucinations, delusions, obsessions or homicidal/suicidal ideation.  Musculoskeletal Global Assessment Spine, Ribs and Pelvis - no instability, subluxation or laxity. Right Upper Extremity - no instability, subluxation or laxity.  Lymphatic Head & Neck  General Head & Neck Lymphatics: Bilateral - Description - No Localized lymphadenopathy. Axillary  General Axillary Region: Bilateral - Description - No Localized lymphadenopathy. Femoral & Inguinal  Generalized Femoral & Inguinal Lymphatics: Left - Description - No Localized lymphadenopathy. Right - Description - No Localized lymphadenopathy.    Assessment & Plan Adin Hector MD; 01/13/2015 10:31 AM)  CHRONIC CHOLECYSTITIS WITH CALCULUS (K80.10)  Current Plans You are being scheduled for surgery - Our schedulers will call you.  You should hear from our office's scheduling department within 5 working days about the location, date, and time of surgery. We try to make accommodations for patient's preferences in scheduling surgery, but sometimes the OR schedule or the surgeon's schedule prevents [place] from making those accommodations.  If you have not heard from our office [phone]) in 5 working days, call the office and ask for your surgeon's nurse.  If you have other questions about your diagnosis, plan, or surgery, call the office and ask for your surgeon's nurse. Pt Education - Pamphlet Given - Laparoscopic Gallbladder Surgery: discussed with patient and provided information.   The anatomy & physiology of hepatobiliary & pancreatic function was discussed. The pathophysiology of gallbladder dysfunction was discussed. Natural history risks  without surgery was discussed. I feel the risks of no intervention will lead to serious problems that outweigh the operative risks; therefore, I recommended cholecystectomy to remove the pathology. I explained laparoscopic techniques with possible need for an open approach. Probable cholangiogram to evaluate the bilary tract was explained as well.  Risks such as bleeding, infection, abscess, leak, injury to other organs, need for further treatment, heart attack, death, and other risks were discussed. I noted a good likelihood this will help address the problem. Possibility that this will not correct all abdominal symptoms was explained. Goals of post-operative recovery were discussed as well. We will work to minimize complications. An educational handout further explaining the pathology and treatment options was given as well. Questions were answered. The patient expresses understanding & wishes to proceed with surgery. Pt Education - CCS  Laparosopic Post Op HCI (Lataya Varnell) Pt Education - CCS Good Bowel Health (Luvina Poirier) Pt Education - Laparoscopic Cholecystectomy: gallbladder Pt Education - CCS Pain control - tylenol only: discussed with patient and provided information.  Adin Hector, M.D., F.A.C.S. Gastrointestinal and Minimally Invasive Surgery [facility] [address] [place], [place] [address] [phone] Main / Paging

## 2015-03-12 ENCOUNTER — Other Ambulatory Visit: Payer: Self-pay | Admitting: Surgery

## 2015-03-12 HISTORY — PX: LAPAROSCOPIC CHOLECYSTECTOMY SINGLE SITE WITH INTRAOPERATIVE CHOLANGIOGRAM: SHX6538

## 2015-03-22 ENCOUNTER — Encounter (HOSPITAL_COMMUNITY): Payer: Self-pay | Admitting: Emergency Medicine

## 2015-03-22 ENCOUNTER — Emergency Department (HOSPITAL_COMMUNITY): Payer: BC Managed Care – PPO

## 2015-03-22 ENCOUNTER — Inpatient Hospital Stay (HOSPITAL_COMMUNITY)
Admission: EM | Admit: 2015-03-22 | Discharge: 2015-03-26 | DRG: 921 | Disposition: A | Discharge status: Home / Self Care | Payer: BC Managed Care – PPO | Attending: Surgery | Admitting: Surgery

## 2015-03-22 DIAGNOSIS — R188 Other ascites: Secondary | ICD-10-CM | POA: Diagnosis present

## 2015-03-22 DIAGNOSIS — K668 Other specified disorders of peritoneum: Secondary | ICD-10-CM | POA: Diagnosis present

## 2015-03-22 DIAGNOSIS — T8189XA Other complications of procedures, not elsewhere classified, initial encounter: Principal | ICD-10-CM | POA: Diagnosis present

## 2015-03-22 DIAGNOSIS — Z91030 Bee allergy status: Secondary | ICD-10-CM | POA: Diagnosis not present

## 2015-03-22 DIAGNOSIS — Z888 Allergy status to other drugs, medicaments and biological substances status: Secondary | ICD-10-CM | POA: Diagnosis not present

## 2015-03-22 DIAGNOSIS — F329 Major depressive disorder, single episode, unspecified: Secondary | ICD-10-CM | POA: Diagnosis present

## 2015-03-22 DIAGNOSIS — Z9049 Acquired absence of other specified parts of digestive tract: Secondary | ICD-10-CM | POA: Diagnosis not present

## 2015-03-22 DIAGNOSIS — K8010 Calculus of gallbladder with chronic cholecystitis without obstruction: Secondary | ICD-10-CM

## 2015-03-22 DIAGNOSIS — Z79899 Other long term (current) drug therapy: Secondary | ICD-10-CM | POA: Diagnosis not present

## 2015-03-22 DIAGNOSIS — R911 Solitary pulmonary nodule: Secondary | ICD-10-CM | POA: Diagnosis present

## 2015-03-22 DIAGNOSIS — Z87891 Personal history of nicotine dependence: Secondary | ICD-10-CM | POA: Diagnosis not present

## 2015-03-22 DIAGNOSIS — Y838 Other surgical procedures as the cause of abnormal reaction of the patient, or of later complication, without mention of misadventure at the time of the procedure: Secondary | ICD-10-CM | POA: Diagnosis present

## 2015-03-22 DIAGNOSIS — Z8249 Family history of ischemic heart disease and other diseases of the circulatory system: Secondary | ICD-10-CM | POA: Diagnosis not present

## 2015-03-22 DIAGNOSIS — I10 Essential (primary) hypertension: Secondary | ICD-10-CM | POA: Diagnosis present

## 2015-03-22 HISTORY — DX: Calculus of gallbladder with chronic cholecystitis without obstruction: K80.10

## 2015-03-22 LAB — LIPASE, BLOOD: Lipase: 24 U/L (ref 11–51)

## 2015-03-22 LAB — COMPREHENSIVE METABOLIC PANEL
ALT: 365 U/L — ABNORMAL HIGH (ref 14–54)
AST: 174 U/L — ABNORMAL HIGH (ref 15–41)
Albumin: 3.1 g/dL — ABNORMAL LOW (ref 3.5–5.0)
Alkaline Phosphatase: 495 U/L — ABNORMAL HIGH (ref 38–126)
Anion gap: 10 (ref 5–15)
BUN: 12 mg/dL (ref 6–20)
CO2: 25 mmol/L (ref 22–32)
Calcium: 9.5 mg/dL (ref 8.9–10.3)
Chloride: 102 mmol/L (ref 101–111)
Creatinine, Ser: 1.31 mg/dL — ABNORMAL HIGH (ref 0.44–1.00)
GFR calc Af Amer: 50 mL/min — ABNORMAL LOW (ref >60)
GFR calc non Af Amer: 43 mL/min — ABNORMAL LOW (ref >60)
Glucose, Bld: 122 mg/dL — ABNORMAL HIGH (ref 65–99)
Potassium: 4.6 mmol/L (ref 3.5–5.1)
Sodium: 137 mmol/L (ref 135–145)
Total Bilirubin: 1.7 mg/dL — ABNORMAL HIGH (ref 0.3–1.2)
Total Protein: 7.8 g/dL (ref 6.5–8.1)

## 2015-03-22 LAB — URINALYSIS, ROUTINE W REFLEX MICROSCOPIC
Bilirubin Urine: NEGATIVE
Glucose, UA: NEGATIVE mg/dL
Hgb urine dipstick: NEGATIVE
Ketones, ur: NEGATIVE mg/dL
Nitrite: NEGATIVE
Protein, ur: NEGATIVE mg/dL
Specific Gravity, Urine: 1.006 (ref 1.005–1.030)
pH: 6 (ref 5.0–8.0)

## 2015-03-22 LAB — CBC WITH DIFFERENTIAL/PLATELET
Basophils Absolute: 0 10*3/uL (ref 0.0–0.1)
Basophils Relative: 0 %
Eosinophils Absolute: 0.5 10*3/uL (ref 0.0–0.7)
Eosinophils Relative: 3 %
HCT: 40.9 % (ref 36.0–46.0)
Hemoglobin: 13.8 g/dL (ref 12.0–15.0)
Lymphocytes Relative: 11 %
Lymphs Abs: 1.9 10*3/uL (ref 0.7–4.0)
MCH: 31.2 pg (ref 26.0–34.0)
MCHC: 33.7 g/dL (ref 30.0–36.0)
MCV: 92.3 fL (ref 78.0–100.0)
Monocytes Absolute: 1.9 10*3/uL — ABNORMAL HIGH (ref 0.1–1.0)
Monocytes Relative: 11 %
Neutro Abs: 13.8 10*3/uL — ABNORMAL HIGH (ref 1.7–7.7)
Neutrophils Relative %: 75 %
Platelets: 372 10*3/uL (ref 150–400)
RBC: 4.43 MIL/uL (ref 3.87–5.11)
RDW: 14.2 % (ref 11.5–15.5)
WBC: 18.1 10*3/uL — ABNORMAL HIGH (ref 4.0–10.5)

## 2015-03-22 LAB — URINE MICROSCOPIC-ADD ON: RBC / HPF: NONE SEEN RBC/hpf (ref 0–5)

## 2015-03-22 LAB — I-STAT CG4 LACTIC ACID, ED
Lactic Acid, Venous: 1.42 mmol/L (ref 0.5–2.0)
Lactic Acid, Venous: 0.59 mmol/L (ref 0.5–2.0)

## 2015-03-22 MED ORDER — DIPHENHYDRAMINE HCL 50 MG/ML IJ SOLN: 12.5000 mg | Freq: Four times a day (QID) | INTRAMUSCULAR | Status: DC | PRN

## 2015-03-22 MED ORDER — LACTATED RINGERS IV BOLUS (SEPSIS): 1000.0000 mL | Freq: Once | INTRAVENOUS | Status: DC

## 2015-03-22 MED ORDER — PROMETHAZINE HCL 25 MG/ML IJ SOLN
6.2500 mg | INTRAMUSCULAR | Status: DC | PRN
  Filled 2015-03-22: qty 1

## 2015-03-22 MED ORDER — HYDROMORPHONE HCL 1 MG/ML IJ SOLN
0.5000 mg | INTRAMUSCULAR | Status: DC | PRN
  Administered 2015-03-22 – 2015-03-23 (×3): 1 mg via INTRAVENOUS
  Administered 2015-03-23 (×3): 2 mg via INTRAVENOUS
  Administered 2015-03-23 – 2015-03-24 (×3): 1 mg via INTRAVENOUS
  Filled 2015-03-22: qty 1
  Filled 2015-03-22: qty 2
  Filled 2015-03-22 (×3): qty 1
  Filled 2015-03-22: qty 2
  Filled 2015-03-22 (×2): qty 1
  Filled 2015-03-22: qty 2

## 2015-03-22 MED ORDER — ACETAMINOPHEN 325 MG PO TABS: 325.0000 mg | ORAL_TABLET | Freq: Four times a day (QID) | ORAL | Status: DC | PRN

## 2015-03-22 MED ORDER — ONDANSETRON HCL 4 MG/2ML IJ SOLN
4.0000 mg | Freq: Four times a day (QID) | INTRAMUSCULAR | Status: DC | PRN
  Administered 2015-03-24: 4 mg via INTRAVENOUS
  Filled 2015-03-22: qty 2

## 2015-03-22 MED ORDER — PIPERACILLIN-TAZOBACTAM 3.375 G IVPB 30 MIN
3.3750 g | Freq: Once | INTRAVENOUS | Status: AC
  Administered 2015-03-22: 3.375 g via INTRAVENOUS
  Filled 2015-03-22: qty 50

## 2015-03-22 MED ORDER — ONDANSETRON 4 MG PO TBDP: 4.0000 mg | ORAL_TABLET | Freq: Four times a day (QID) | ORAL | Status: DC | PRN

## 2015-03-22 MED ORDER — ALUM & MAG HYDROXIDE-SIMETH 200-200-20 MG/5ML PO SUSP: 30.0000 mL | Freq: Four times a day (QID) | ORAL | Status: DC | PRN

## 2015-03-22 MED ORDER — BLISTEX MEDICATED EX OINT
1.0000 "application " | TOPICAL_OINTMENT | Freq: Two times a day (BID) | CUTANEOUS | Status: DC
  Administered 2015-03-23 – 2015-03-24 (×3): 1 via TOPICAL
  Filled 2015-03-22: qty 10

## 2015-03-22 MED ORDER — LACTATED RINGERS IV BOLUS (SEPSIS): 1000.0000 mL | Freq: Three times a day (TID) | INTRAVENOUS | Status: AC | PRN

## 2015-03-22 MED ORDER — ACETAMINOPHEN 650 MG RE SUPP: 650.0000 mg | Freq: Four times a day (QID) | RECTAL | Status: DC | PRN

## 2015-03-22 MED ORDER — SODIUM CHLORIDE 0.9 % IV BOLUS (SEPSIS)
1000.0000 mL | Freq: Once | INTRAVENOUS | Status: AC
  Administered 2015-03-22: 1000 mL via INTRAVENOUS

## 2015-03-22 MED ORDER — SODIUM CHLORIDE 0.9 % IV SOLN
8.0000 mg | Freq: Four times a day (QID) | INTRAVENOUS | Status: DC | PRN
  Filled 2015-03-22: qty 4

## 2015-03-22 MED ORDER — IOHEXOL 300 MG/ML  SOLN
25.0000 mL | Freq: Once | INTRAMUSCULAR | Status: AC | PRN
  Administered 2015-03-22: 25 mL via ORAL

## 2015-03-22 MED ORDER — MAGIC MOUTHWASH
15.0000 mL | Freq: Four times a day (QID) | ORAL | Status: DC | PRN
  Filled 2015-03-22: qty 15

## 2015-03-22 MED ORDER — PHENOL 1.4 % MT LIQD: 2.0000 | OROMUCOSAL | Status: DC | PRN

## 2015-03-22 MED ORDER — SODIUM CHLORIDE 0.9 % IV BOLUS (SEPSIS): 2000.0000 mL | Freq: Once | INTRAVENOUS | Status: DC

## 2015-03-22 MED ORDER — IOHEXOL 300 MG/ML  SOLN
100.0000 mL | Freq: Once | INTRAMUSCULAR | Status: AC | PRN
  Administered 2015-03-22: 80 mL via INTRAVENOUS

## 2015-03-22 MED ORDER — MENTHOL 3 MG MT LOZG
1.0000 | LOZENGE | OROMUCOSAL | Status: DC | PRN
  Administered 2015-03-25: 3 mg via ORAL
  Filled 2015-03-22: qty 9

## 2015-03-22 NOTE — ED Notes (Signed)
Called lab to add on lipase.  

## 2015-03-22 NOTE — ED Provider Notes (Signed)
CSN: [id number]     Arrival date & time 03/22/15  1322 History   First MD Initiated Contact with Patient 03/22/15 1504     Chief Complaint  Patient presents with  . Fever  . Post-op Problem     (Consider location/radiation/quality/duration/timing/severity/associated sxs/prior Treatment) Patient is a 59 y.o. female presenting with fever.  Fever Max temp prior to arrival:  100.9 Temp source:  Oral Severity:  Mild Onset quality:  Gradual Duration:  6 days Timing:  Intermittent Chronicity:  New Relieved by:  None tried Worsened by:  Nothing tried Ineffective treatments:  None tried Associated symptoms: cough   Associated symptoms: no chest pain, no chills, no diarrhea, no dysuria, no ear pain, no headaches, no myalgias, no nausea, no rash and no vomiting     Past Medical History  Diagnosis Date  . Depression     Tx'd in 08/11/1989 after a death of an infant  . Dyspareunia   . Hypertension    Past Surgical History  Procedure Laterality Date  . Breast surgery  1994    benign cyst removed left breast  . Cesarean section  1991  . Cholecystectomy     Family History  Problem Relation Age of Onset  . Thyroid disease Mother     hypothyroid  . Hypertension Father   . Thyroid disease Sister     Hashimotos   Social History  Substance Use Topics  . Smoking status: Former Smoker    Quit date: 05/01/1984  . Smokeless tobacco: None  . Alcohol Use: No     Comment: once weekly   OB History    Gravida Para Term Preterm AB TAB SAB Ectopic Multiple Living   3 3 3       2      Review of Systems  Constitutional: Positive for fever. Negative for chills.  HENT: Negative for ear pain.   Eyes: Negative for pain and itching.  Respiratory: Positive for cough. Negative for shortness of breath.   Cardiovascular: Negative for chest pain.  Gastrointestinal: Positive for abdominal pain and abdominal distention. Negative for nausea, vomiting, diarrhea and constipation.  Endocrine: Negative  for polydipsia and polyuria.  Genitourinary: Negative for dysuria and enuresis.  Musculoskeletal: Negative for myalgias and back pain.  Skin: Negative for rash and wound.  Neurological: Negative for tremors, syncope and headaches.  All other systems reviewed and are negative.     Allergies  Review of patient's allergies indicates no known allergies.  Home Medications   Prior to Admission medications   Medication Sig Start Date End Date Taking? Authorizing Provider  CALCIUM PO Take 400 mg by mouth daily.    Historical Provider, MD  Cholecalciferol (VITAMIN D-3) 1000 UNITS CAPS Take 2,000 Units by mouth daily.     Historical Provider, MD  Emollient (AQUA GLYCOLIC EX) Apply 1 application topically daily.    Historical Provider, MD  estradiol (ESTRACE VAGINAL) 0.1 MG/GM vaginal cream Use 1/2 gram per vagina twice a week as needed. Patient not taking: Reported on 12/19/2014 10/09/13   Nunzio Cobbs, MD  HYDROcodone-acetaminophen (NORCO/VICODIN) 5-325 MG per tablet Take 1-2 tablets by mouth every 6 (six) hours as needed. 12/19/14   Glendell Docker, NP  losartan (COZAAR) 100 MG tablet Take 100 mg by mouth daily.    Historical Provider, MD  Polyvinyl Alcohol-Povidone (REFRESH OP) Apply 1-2 drops to eye daily as needed (dry eyes.).    Historical Provider, MD   BP 124/76 mmHg  Pulse 117  Temp(Src) 98.3 F (36.8 C) (Oral)  Resp 26  Ht 5' 7.5" (1.715 m)  Wt 179 lb 3 oz (81.279 kg)  BMI 27.63 kg/m2  SpO2 95%  LMP 05/01/2004 Physical Exam  Constitutional: She is oriented to person, place, and time. She appears well-developed and well-nourished.  HENT:  Head: Normocephalic and atraumatic.  Neck: Normal range of motion.  Cardiovascular: Normal rate and regular rhythm.   Pulmonary/Chest: No stridor. No respiratory distress.  Abdominal: Soft. Bowel sounds are normal. She exhibits distension. There is tenderness. There is no rebound and no guarding.  Musculoskeletal: Normal range of  motion. She exhibits no edema or tenderness.  Neurological: She is alert and oriented to person, place, and time. No cranial nerve deficit.  Skin: Skin is warm and dry. No rash noted.  Nursing note and vitals reviewed.   ED Course  Procedures (including critical care time) CRITICAL CARE Performed by: Merrily Pew   Total critical care time: 35 minutes  Critical care time was exclusive of separately billable procedures and treating other patients.  Critical care was necessary to treat or prevent imminent or life-threatening deterioration.  Critical care was time spent personally by me on the following activities: development of treatment plan with patient and/or surrogate as well as nursing, discussions with consultants, evaluation of patient's response to treatment, examination of patient, obtaining history from patient or surrogate, ordering and performing treatments and interventions, ordering and review of laboratory studies, ordering and review of radiographic studies, pulse oximetry and re-evaluation of patient's condition.   Labs Review Labs Reviewed  COMPREHENSIVE METABOLIC PANEL - Abnormal; Notable for the following:    Glucose, Bld 122 (*)    Creatinine, Ser 1.31 (*)    Albumin 3.1 (*)    AST 174 (*)    ALT 365 (*)    Alkaline Phosphatase 495 (*)    Total Bilirubin 1.7 (*)    GFR calc non Af Amer 43 (*)    GFR calc Af Amer 50 (*)    All other components within normal limits  CBC WITH DIFFERENTIAL/PLATELET - Abnormal; Notable for the following:    WBC 18.1 (*)    Neutro Abs 13.8 (*)    Monocytes Absolute 1.9 (*)    All other components within normal limits  CULTURE, BLOOD (ROUTINE X 2)  CULTURE, BLOOD (ROUTINE X 2)  URINE CULTURE  URINALYSIS, ROUTINE W REFLEX MICROSCOPIC (NOT AT [facility])  LIPASE, BLOOD  I-STAT CG4 LACTIC ACID, ED    Imaging Review Dg Chest 2 View  03/22/2015  CLINICAL DATA:  59 year old female history of cough for the past 10 days following  cholecystectomy. Elevated white blood cell count and new onset of fever. EXAM: CHEST  2 VIEW COMPARISON:  Chest x-ray 12/19/2014. FINDINGS: New elevation of the right hemidiaphragm. Linear opacity in the right lung base favored to reflect some passive subsegmental atelectasis. No acute consolidative airspace disease. No pleural effusions. No evidence of pulmonary edema. Heart size is normal. Upper mediastinal contours are within normal limits. IMPRESSION: 1. New elevation of the right hemidiaphragm compared to prior study from 12/19/2014. Given the patient's history of recent cholecystectomy, elevated white blood cell count and fever, the possibility of complications such as postcholecystectomy abscess or biloma should be considered, and further evaluation with contrast-enhanced CT of the abdomen and pelvis is suggested at this time. These results were called by telephone at the time of interpretation on 03/22/2015 at 2:54 pm to Dr. Tyrone Nine, who verbally acknowledged these results. Electronically Signed  By: Vinnie Langton M.D.   On: 03/22/2015 14:56   I have personally reviewed and evaluated these images and lab results as part of my medical decision-making.   EKG Interpretation None      MDM   Final diagnoses:  Essential hypertension  Intra-abdominal fluid collection  Sepsis  59 yo F w/ worsening cough for last few days with nighttime fever and chills. Lap chole 11 days ago. cxr with elevated right hemidiaphragm without other causes for cough, also with white count and temp >100 and tachycardia, concern for PE v post op complication. CT chest and abdomen confirmed fluid collection under liver so zosyn started for presumed intraabdominal infection and with leukocytosis and tachycardia was positive for sepsis. Lactic acid ok so doubt shock. This is likely causing distension and atelectasis leading to cough. Had spoken with surgery team earlier and had requested transfer to [facility], spoke with  on-call surgeion, Dr. Rosendo Gros, who was ok with her transfer as Dr. Johney Maine had already put in orders so was transferred uneventfully to [facility] long for definitive management and diagnosis.       Merrily Pew, MD 03/23/15 [phone]

## 2015-03-22 NOTE — ED Notes (Signed)
Patient transported to CT 

## 2015-03-22 NOTE — ED Notes (Signed)
S/p lap coly 10 days ago, now with fever and WBC 16 per PCP, also coughing, no V/D, NAD

## 2015-03-22 NOTE — Progress Notes (Signed)
Shelby Becker  [birth year] [id number]  Patient Care Team: Hulan Fess, MD as PCP - General (Family Medicine) Michael Boston, MD as Consulting Physician (General Surgery) Nunzio Cobbs, MD as Consulting Physician (Obstetrics and Gynecology)  This patient is a 59 y.o.female who calls today for surgical evaluation.   Date of procedure/visit: 03/12/2015  Surgery: lap chole with IOC  Reason for call: Going to ED  Patient status post cholecystectomy for chronic cholecystitis.  Left message about pathology results last week but never heard back.  Apparently patient had cough and low-grade fevers at home.  Saw primary care physician.  Elevated white count.  She was told to go the emergency room.  She called our office that was going.  Went to [facility] emergency room.  Mildly elevated liver function tests and creatinine.  Denying much in the way of abdominal pain or discomfort.  Emergency room doctor wishes to get CT scan to rule out abscess or biloma since plain films with elevated diaphragm.  If that is normal, they are planning to let her go home with close outpatient follow-up.  I would have a low threshold to admit her if she is not feeling better.  If she has evidence of a fluid collection, admit, IV antibiotics, percutaneous drainage to rule out biloma or abscess.  If nothing drainable, can trying DC home if meets criteria & feeling better.  Patient Active Problem List   Diagnosis Date Noted  . Chronic cholecystitis with calculus s/p lap chole 03/12/2015 03/22/2015  . Hypertension     Past Medical History  Diagnosis Date  . Depression     Tx'd in 21-Jul-1989 after a death of an infant  . Dyspareunia   . Hypertension   . Chronic cholecystitis with calculus s/p lap chole 03/12/2015 03/22/2015    Past Surgical History  Procedure Laterality Date  . Breast surgery  1994    benign cyst removed left breast  . Cesarean section  1991  . Laparoscopic cholecystectomy single site with  intraoperative cholangiogram  03/12/2015    Dr Johney Maine    Social History   Social History  . Marital Status: Single    Spouse Name: N/A  . Number of Children: N/A  . Years of Education: N/A   Occupational History  . Not on file.   Social History Main Topics  . Smoking status: Former Smoker    Quit date: 05/01/1984  . Smokeless tobacco: Not on file  . Alcohol Use: No     Comment: once weekly  . Drug Use: No  . Sexual Activity:    Partners: Male    Birth Control/ Protection: Post-menopausal   Other Topics Concern  . Not on file   Social History Narrative    Family History  Problem Relation Age of Onset  . Thyroid disease Mother     hypothyroid  . Hypertension Father   . Thyroid disease Sister     Hashimotos    Current Facility-Administered Medications  Medication Dose Route Frequency Provider Last Rate Last Dose  . acetaminophen (TYLENOL) suppository 650 mg  650 mg Rectal Q6H PRN Michael Boston, MD      . acetaminophen (TYLENOL) tablet 325-650 mg  325-650 mg Oral Q6H PRN Michael Boston, MD      . alum & mag hydroxide-simeth (MAALOX/MYLANTA) 200-200-20 MG/5ML suspension 30 mL  30 mL Oral Q6H PRN Michael Boston, MD      . diphenhydrAMINE (BENADRYL) injection 12.5-25 mg  12.5-25 mg Intravenous Q6H  PRN Michael Boston, MD      . HYDROmorphone (DILAUDID) injection 0.5-2 mg  0.5-2 mg Intravenous Q2H PRN Michael Boston, MD      . lactated ringers bolus 1,000 mL  1,000 mL Intravenous Once Michael Boston, MD      . lactated ringers bolus 1,000 mL  1,000 mL Intravenous Q8H PRN Michael Boston, MD      . lip balm (CARMEX) ointment 1 application  1 application Topical BID Michael Boston, MD      . magic mouthwash  15 mL Oral QID PRN Michael Boston, MD      . menthol-cetylpyridinium (CEPACOL) lozenge 3 mg  1 lozenge Oral PRN Michael Boston, MD      . ondansetron [facility]) injection 4 mg  4 mg Intravenous Q6H PRN Michael Boston, MD       Or  . ondansetron (ZOFRAN) 8 mg in sodium chloride 0.9 % 50 mL IVPB   8 mg Intravenous Q6H PRN Michael Boston, MD      . ondansetron (ZOFRAN-ODT) disintegrating tablet 4-8 mg  4-8 mg Oral Q6H PRN Michael Boston, MD      . phenol (CHLORASEPTIC) mouth spray 2 spray  2 spray Mouth/Throat PRN Michael Boston, MD      . promethazine (PHENERGAN) injection 6.25-12.5 mg  6.25-12.5 mg Intravenous Q4H PRN Michael Boston, MD       Current Outpatient Prescriptions  Medication Sig Dispense Refill  . acetaminophen (TYLENOL) 325 MG tablet Take 650 mg by mouth every 6 (six) hours as needed for moderate pain.     Marland Kitchen dextromethorphan (DELSYM) 30 MG/5ML liquid Take 60 mg by mouth as needed for cough.    . EPIPEN 2-PAK 0.3 MG/0.3ML SOAJ injection Inject 0.3 mg into the muscle once.    Marland Kitchen losartan (COZAAR) 100 MG tablet Take 100 mg by mouth daily.    Marland Kitchen oxyCODONE (OXY IR/ROXICODONE) 5 MG immediate release tablet Take 5 mg by mouth daily as needed.    Marland Kitchen PEDIALYTE (PEDIALYTE) SOLN Take 240 mLs by mouth every 8 (eight) hours as needed (for minerals).    Marland Kitchen PROAIR HFA 108 (90 BASE) MCG/ACT inhaler Inhale 2 puffs into the lungs daily as needed.    . Probiotic Product (PROBIOTIC DAILY) CAPS Take 1 capsule by mouth daily.    Marland Kitchen estradiol (ESTRACE VAGINAL) 0.1 MG/GM vaginal cream Use 1/2 gram per vagina twice a week as needed. (Patient not taking: Reported on 12/19/2014) 42.5 g 3  . HYDROcodone-acetaminophen (NORCO/VICODIN) 5-325 MG per tablet Take 1-2 tablets by mouth every 6 (six) hours as needed. 10 tablet 0     Allergies  Allergen Reactions  . Nsaids Other (See Comments)    Low EGFR  . Bee Venom Swelling    Local swelling at site Has epipen    BP 124/76 mmHg  Pulse 117  Temp(Src) 98.3 F (36.8 C) (Oral)  Resp 26  Ht 5' 7.5" (1.715 m)  Wt 81.279 kg (179 lb 3 oz)  BMI 27.63 kg/m2  SpO2 95%  LMP 05/01/2004  Dg Chest 2 View  03/22/2015  CLINICAL DATA:  58 year old female history of cough for the past 10 days following cholecystectomy. Elevated white blood cell count and new onset of  fever. EXAM: CHEST  2 VIEW COMPARISON:  Chest x-ray 12/19/2014. FINDINGS: New elevation of the right hemidiaphragm. Linear opacity in the right lung base favored to reflect some passive subsegmental atelectasis. No acute consolidative airspace disease. No pleural effusions. No evidence of pulmonary edema. Heart size  is normal. Upper mediastinal contours are within normal limits. IMPRESSION: 1. New elevation of the right hemidiaphragm compared to prior study from 12/19/2014. Given the patient's history of recent cholecystectomy, elevated white blood cell count and fever, the possibility of complications such as postcholecystectomy abscess or biloma should be considered, and further evaluation with contrast-enhanced CT of the abdomen and pelvis is suggested at this time. These results were called by telephone at the time of interpretation on 03/22/2015 at 2:54 pm to Dr. Tyrone Nine, who verbally acknowledged these results. Electronically Signed   By: Vinnie Langton M.D.   On: 03/22/2015 14:56    Note: This dictation was prepared with Dragon/digital dictation along with [employer]. Any transcriptional errors that result from this process are unintentional.

## 2015-03-23 ENCOUNTER — Inpatient Hospital Stay (HOSPITAL_COMMUNITY): Payer: BC Managed Care – PPO

## 2015-03-23 DIAGNOSIS — R188 Other ascites: Secondary | ICD-10-CM | POA: Diagnosis present

## 2015-03-23 LAB — PROTIME-INR
INR: 1.14 (ref 0.00–1.49)
Prothrombin Time: 14.8 seconds (ref 11.6–15.2)

## 2015-03-23 MED ORDER — CETYLPYRIDINIUM CHLORIDE 0.05 % MT LIQD
7.0000 mL | Freq: Two times a day (BID) | OROMUCOSAL | Status: DC
  Administered 2015-03-23: 7 mL via OROMUCOSAL

## 2015-03-23 MED ORDER — MIDAZOLAM HCL 2 MG/2ML IJ SOLN
INTRAMUSCULAR | Status: AC | PRN
  Administered 2015-03-23 (×2): 1 mg via INTRAVENOUS

## 2015-03-23 MED ORDER — CEFTRIAXONE SODIUM 2 G IJ SOLR
2.0000 g | INTRAMUSCULAR | Status: DC
  Administered 2015-03-23 – 2015-03-26 (×4): 2 g via INTRAVENOUS
  Filled 2015-03-23 (×4): qty 2

## 2015-03-23 MED ORDER — KCL IN DEXTROSE-NACL 40-5-0.45 MEQ/L-%-% IV SOLN
INTRAVENOUS | Status: DC
  Administered 2015-03-23: 09:00:00 via INTRAVENOUS
  Filled 2015-03-23 (×3): qty 1000

## 2015-03-23 MED ORDER — CHLORHEXIDINE GLUCONATE 0.12 % MT SOLN
15.0000 mL | Freq: Two times a day (BID) | OROMUCOSAL | Status: DC
  Administered 2015-03-23 – 2015-03-25 (×6): 15 mL via OROMUCOSAL
  Filled 2015-03-23 (×8): qty 15

## 2015-03-23 MED ORDER — METRONIDAZOLE IN NACL 5-0.79 MG/ML-% IV SOLN
500.0000 mg | Freq: Four times a day (QID) | INTRAVENOUS | Status: DC
  Administered 2015-03-23 – 2015-03-26 (×12): 500 mg via INTRAVENOUS
  Filled 2015-03-23 (×15): qty 100

## 2015-03-23 MED ORDER — MIDAZOLAM HCL 2 MG/2ML IJ SOLN
INTRAMUSCULAR | Status: AC
  Filled 2015-03-23: qty 2

## 2015-03-23 MED ORDER — LACTATED RINGERS IV BOLUS (SEPSIS)
1000.0000 mL | Freq: Once | INTRAVENOUS | Status: AC
  Administered 2015-03-23: 1000 mL via INTRAVENOUS

## 2015-03-23 MED ORDER — MIDAZOLAM HCL 2 MG/2ML IJ SOLN
INTRAMUSCULAR | Status: AC
  Filled 2015-03-23: qty 4

## 2015-03-23 MED ORDER — FLUMAZENIL 0.5 MG/5ML IV SOLN
INTRAVENOUS | Status: AC
  Filled 2015-03-23: qty 5

## 2015-03-23 MED ORDER — FENTANYL CITRATE (PF) 100 MCG/2ML IJ SOLN
INTRAMUSCULAR | Status: AC
  Filled 2015-03-23: qty 2

## 2015-03-23 MED ORDER — NALOXONE HCL 0.4 MG/ML IJ SOLN
INTRAMUSCULAR | Status: AC
  Filled 2015-03-23: qty 1

## 2015-03-23 NOTE — Procedures (Signed)
Interventional Radiology Procedure Note  Procedure: US guided 58F drain placement into the right subdiaphragmatic space.  ~600cc of dark bilious fluid aspirated.  Gravity drain.  Complications: None  Recommendations:  - flush catheter daily with sterile saline.  - record output daily - Routine care  - may follow up in VIR drain clinic if need be  Signed,  Dulcy Fanny. Earleen Newport, DO

## 2015-03-23 NOTE — H&P (Signed)
[address] [place], [place] [address] Phone: [phone] FAX: [phone]     Shelby Becker  1955-06-09 [id number]  CARE TEAM:  PCP: Gennette Pac, MD  Outpatient Care Team: Patient Care Team: Hulan Fess, MD as PCP - General (Family Medicine) Michael Boston, MD as Consulting Physician (General Surgery) Nunzio Cobbs, MD as Consulting Physician (Obstetrics and Gynecology)  Inpatient Treatment Team: Treatment Team: Attending Provider: Michael Boston, MD; Registered Nurse: Ananias Pilgrim, RN  This patient is a 59 y.o.female who presents today for surgical evaluation at the request of [facility].   Reason for evaluation: Fever  Pleasant woman with symptomatically gallstones who underwent elective cholecystectomy on November 11 without event.  Went home the same day.  Thought she consistent with chronic cholecystitis.  We left message to check up on her with an appointment.  Never heard back.  They do not recall getting a message.  She struggled with some mild diarrhea the first few days.  Took Imodium once.  Became bloated and constipated.  Called last Friday.  Dr. Harlow Asa recommended liquids and milk of magnesia.  That improved things.   However she was having pressure with deep breaths and her coughing was worse.  Apparently for the past week she has had some mild fevers.  Appetite okay.  No nausea or vomiting.  No diarrhea.  Some difficulty with deep breaths and cough.  Energy level down.  Did not call her office.  She has had a chronic cough for several weeks Went to primary care office.  Low-grade fever noted.  White count of 16.  They recommended patient go to emergency room.  She called [place] on the way there.  According to the emergency room doctor she did not particularly toxic.  She was not in shock.  Workup did reveal fluid collection right upper quadrant.  Liver function tests mildly elevated.   Creatinine mildly elevated as well.  Recommendation made for rehydration, antibiotics, and percutaneous drainage.  Past Medical History  Diagnosis Date  . Depression     Tx'd in Jul 23, 1989 after a death of an infant  . Dyspareunia   . Hypertension   . Chronic cholecystitis with calculus s/p lap chole 03/12/2015 03/22/2015    Past Surgical History  Procedure Laterality Date  . Breast surgery  1994    benign cyst removed left breast  . Cesarean section  1991  . Laparoscopic cholecystectomy single site with intraoperative cholangiogram  03/12/2015    Dr Johney Maine  . Cholecystectomy      Social History   Social History  . Marital Status: Single    Spouse Name: N/A  . Number of Children: N/A  . Years of Education: N/A   Occupational History  . Not on file.   Social History Main Topics  . Smoking status: Former Smoker    Quit date: 05/01/1984  . Smokeless tobacco: Not on file  . Alcohol Use: No     Comment: once weekly  . Drug Use: No  . Sexual Activity:    Partners: Male    Birth Control/ Protection: Post-menopausal   Other Topics Concern  . Not on file   Social History Narrative    Family History  Problem Relation Age of Onset  . Thyroid disease Mother     hypothyroid  . Hypertension Father   . Thyroid disease Sister     [place]    Current Facility-Administered Medications  Medication Dose Route Frequency Provider Last Rate Last Dose  . acetaminophen (TYLENOL) suppository 650 mg  650 mg Rectal Q6H PRN Michael Boston, MD      . acetaminophen (TYLENOL) tablet 325-650 mg  325-650 mg Oral Q6H PRN Michael Boston, MD      . alum & mag hydroxide-simeth (MAALOX/MYLANTA) 200-200-20 MG/5ML suspension 30 mL  30 mL Oral Q6H PRN Michael Boston, MD      . antiseptic oral rinse (CPC / CETYLPYRIDINIUM CHLORIDE 0.05%) solution 7 mL  7 mL Mouth Rinse q12n4p Michael Boston, MD      . cefTRIAXone (ROCEPHIN) 2 g in dextrose 5 % 50 mL IVPB  2 g Intravenous Q24H Michael Boston, MD      .  chlorhexidine (PERIDEX) 0.12 % solution 15 mL  15 mL Mouth Rinse BID Michael Boston, MD      . dextrose 5 % and 0.45 % NaCl with KCl 40 mEq/L infusion   Intravenous Continuous Michael Boston, MD      . diphenhydrAMINE (BENADRYL) injection 12.5-25 mg  12.5-25 mg Intravenous Q6H PRN Michael Boston, MD      . HYDROmorphone (DILAUDID) injection 0.5-2 mg  0.5-2 mg Intravenous Q2H PRN Michael Boston, MD   2 mg at 03/23/15 0254  . lactated ringers bolus 1,000 mL  1,000 mL Intravenous Once Michael Boston, MD      . lactated ringers bolus 1,000 mL  1,000 mL Intravenous Q8H PRN Michael Boston, MD      . lactated ringers bolus 1,000 mL  1,000 mL Intravenous Once Michael Boston, MD      . lip balm (BLISTEX) ointment 1 application  1 application Topical BID Michael Boston, MD      . magic mouthwash  15 mL Oral QID PRN Michael Boston, MD      . menthol-cetylpyridinium (CEPACOL) lozenge 3 mg  1 lozenge Oral PRN Michael Boston, MD      . metroNIDAZOLE (FLAGYL) IVPB 500 mg  500 mg Intravenous Q6H Michael Boston, MD      . ondansetron [facility]) injection 4 mg  4 mg Intravenous Q6H PRN Michael Boston, MD       Or  . ondansetron (ZOFRAN) 8 mg in sodium chloride 0.9 % 50 mL IVPB  8 mg Intravenous Q6H PRN Michael Boston, MD      . ondansetron (ZOFRAN-ODT) disintegrating tablet 4-8 mg  4-8 mg Oral Q6H PRN Michael Boston, MD      . phenol (CHLORASEPTIC) mouth spray 2 spray  2 spray Mouth/Throat PRN Michael Boston, MD      . promethazine (PHENERGAN) injection 6.25-12.5 mg  6.25-12.5 mg Intravenous Q4H PRN Michael Boston, MD         Allergies  Allergen Reactions  . Nsaids Other (See Comments)    Low EGFR  . Bee Venom Swelling    Local swelling at site Has epipen    ROS: Constitutional:  No fevers, chills, sweats.  Weight stable Eyes:  No vision changes, No discharge HENT:  No sore throats, nasal drainage Lymph: No neck swelling, No bruising easily Pulmonary:  No cough, productive sputum CV: No orthopnea, PND  Patient walks 30 minutes for  about 1 miles without difficulty.  No exertional chest/neck/shoulder/arm pain. GI:  No personal nor family history of GI/colon cancer, inflammatory bowel disease, irritable bowel syndrome, allergy such as Celiac Sprue, dietary/dairy problems, colitis, ulcers nor gastritis.  No recent sick contacts/gastroenteritis.  No travel outside the country.  No changes in diet. Renal: No UTIs,  No hematuria Genital:  No drainage, bleeding, masses Musculoskeletal: No severe joint pain.  Good ROM major joints Skin:  No sores or lesions.  No rashes Heme/Lymph:  No easy bleeding.  No swollen lymph nodes Neuro: No focal weakness/numbness.  No seizures Psych: No suicidal ideation.  No hallucinations  BP 146/78 mmHg  Pulse 114  Temp(Src) 98.1 F (36.7 C) (Oral)  Resp 22  Ht 5' 7.5" (1.715 m)  Wt 82.01 kg (180 lb 12.8 oz)  BMI 27.88 kg/m2  SpO2 98%  LMP 05/01/2004  Physical Exam: General: Pt awake/alert/oriented x4 in no major acute distress Eyes: PERRL, normal EOM. Sclera nonicteric Neuro: CN II-XII intact w/o focal sensory/motor deficits. Lymph: No head/neck/groin lymphadenopathy Psych:  No delerium/psychosis/paranoia HENT: Normocephalic, Mucus membranes moist.  No thrush Neck: Supple, No tracheal deviation Chest: No pain.  Good respiratory excursion. CV:  Pulses intact.  Regular rhythm Abdomen: Soft, Nondistended.  Tender in RUQ.  No incarcerated hernias. Ext:  SCDs BLE.  No significant edema.  No cyanosis Skin: No petechiae / purpurea.  No major sores Musculoskeletal: No severe joint pain.  Good ROM major joints   Results:   Labs: Results for orders placed or performed during the hospital encounter of 03/22/15 (from the past 48 hour(s))  Comprehensive metabolic panel     Status: Abnormal   Collection Time: 03/22/15  2:12 PM  Result Value Ref Range   Sodium 137 135 - 145 mmol/L   Potassium 4.6 3.5 - 5.1 mmol/L   Chloride 102 101 - 111 mmol/L   CO2 25 22 - 32 mmol/L   Glucose, Bld 122  (H) 65 - 99 mg/dL   BUN 12 6 - 20 mg/dL   Creatinine, Ser 1.31 (H) 0.44 - 1.00 mg/dL   Calcium 9.5 8.9 - 10.3 mg/dL   Total Protein 7.8 6.5 - 8.1 g/dL   Albumin 3.1 (L) 3.5 - 5.0 g/dL   AST 174 (H) 15 - 41 U/L   ALT 365 (H) 14 - 54 U/L   Alkaline Phosphatase 495 (H) 38 - 126 U/L   Total Bilirubin 1.7 (H) 0.3 - 1.2 mg/dL   GFR calc non Af Amer 43 (L) >60 mL/min   GFR calc Af Amer 50 (L) >60 mL/min    Comment: (NOTE) The eGFR has been calculated using the CKD EPI equation. This calculation has not been validated in all clinical situations. eGFR's persistently <60 mL/min signify possible Chronic Kidney Disease.    Anion gap 10 5 - 15  CBC with Differential     Status: Abnormal   Collection Time: 03/22/15  2:12 PM  Result Value Ref Range   WBC 18.1 (H) 4.0 - 10.5 K/uL   RBC 4.43 3.87 - 5.11 MIL/uL   Hemoglobin 13.8 12.0 - 15.0 g/dL   HCT 40.9 36.0 - 46.0 %   MCV 92.3 78.0 - 100.0 fL   MCH 31.2 26.0 - 34.0 pg   MCHC 33.7 30.0 - 36.0 g/dL   RDW 14.2 11.5 - 15.5 %   Platelets 372 150 - 400 K/uL   Neutrophils Relative % 75 %   Neutro Abs 13.8 (H) 1.7 - 7.7 K/uL   Lymphocytes Relative 11 %   Lymphs Abs 1.9 0.7 - 4.0 K/uL   Monocytes Relative 11 %   Monocytes Absolute 1.9 (H) 0.1 - 1.0 K/uL   Eosinophils Relative 3 %   Eosinophils Absolute 0.5 0.0 - 0.7 K/uL   Basophils Relative 0 %   Basophils Absolute 0.0 0.0 -  0.1 K/uL  Lipase, blood     Status: None   Collection Time: 03/22/15  2:12 PM  Result Value Ref Range   Lipase 24 11 - 51 U/L  I-Stat CG4 Lactic Acid, ED  (not at [facility])     Status: None   Collection Time: 03/22/15  2:19 PM  Result Value Ref Range   Lactic Acid, Venous 1.42 0.5 - 2.0 mmol/L  Urinalysis, Routine w reflex microscopic (not at [facility])     Status: Abnormal   Collection Time: 03/22/15  3:44 PM  Result Value Ref Range   Color, Urine YELLOW YELLOW   APPearance HAZY (A) CLEAR   Specific Gravity, Urine 1.006 1.005 - 1.030   pH 6.0 5.0 - 8.0   Glucose, UA  NEGATIVE NEGATIVE mg/dL   Hgb urine dipstick NEGATIVE NEGATIVE   Bilirubin Urine NEGATIVE NEGATIVE   Ketones, ur NEGATIVE NEGATIVE mg/dL   Protein, ur NEGATIVE NEGATIVE mg/dL   Nitrite NEGATIVE NEGATIVE   Leukocytes, UA MODERATE (A) NEGATIVE  Urine microscopic-add on     Status: Abnormal   Collection Time: 03/22/15  3:44 PM  Result Value Ref Range   Squamous Epithelial / LPF 6-30 (A) NONE SEEN    Comment: Please note change in reference range.   WBC, UA 6-30 0 - 5 WBC/hpf    Comment: Please note change in reference range.   RBC / HPF NONE SEEN 0 - 5 RBC/hpf    Comment: Please note change in reference range.   Bacteria, UA FEW (A) NONE SEEN    Comment: Please note change in reference range.  I-Stat CG4 Lactic Acid, ED  (not at [facility])     Status: None   Collection Time: 03/22/15  5:40 PM  Result Value Ref Range   Lactic Acid, Venous 0.59 0.5 - 2.0 mmol/L    Imaging / Studies: Dg Chest 2 View  03/22/2015  CLINICAL DATA:  59 year old female history of cough for the past 10 days following cholecystectomy. Elevated white blood cell count and new onset of fever. EXAM: CHEST  2 VIEW COMPARISON:  Chest x-ray 12/19/2014. FINDINGS: New elevation of the right hemidiaphragm. Linear opacity in the right lung base favored to reflect some passive subsegmental atelectasis. No acute consolidative airspace disease. No pleural effusions. No evidence of pulmonary edema. Heart size is normal. Upper mediastinal contours are within normal limits. IMPRESSION: 1. New elevation of the right hemidiaphragm compared to prior study from 12/19/2014. Given the patient's history of recent cholecystectomy, elevated white blood cell count and fever, the possibility of complications such as postcholecystectomy abscess or biloma should be considered, and further evaluation with contrast-enhanced CT of the abdomen and pelvis is suggested at this time. These results were called by telephone at the time of interpretation on  03/22/2015 at 2:54 pm to Dr. Tyrone Nine, who verbally acknowledged these results. Electronically Signed   By: Vinnie Langton M.D.   On: 03/22/2015 14:56   Ct Chest W Contrast  03/22/2015  CLINICAL DATA:  Postoperative cholecystectomy 10 days ago. Increasing shortness of breath and cough since surgery. Low grade fever. EXAM: CT CHEST, ABDOMEN, AND PELVIS WITH CONTRAST TECHNIQUE: Multidetector CT imaging of the chest, abdomen and pelvis was performed following the standard protocol during bolus administration of intravenous contrast. CONTRAST:  58m OMNIPAQUE IOHEXOL 300 MG/ML  SOLN COMPARISON:  Ultrasound abdomen 11/2014 FINDINGS: CT CHEST FINDINGS Mediastinum/Lymph Nodes: No masses, pathologically enlarged lymph nodes, or other significant abnormality. Lungs/Pleura: Small right pleural effusion with atelectasis in the  right lung base. Nodular opacity in the superior segment right lower lobe adjacent to an area of atelectasis. The nodule measures about 9 mm diameter. Several additional scattered nodules are demonstrated bilaterally. Followup chest CT in 3 months after resolution of acute process is recommended to evaluate for persistence. No pneumothorax. Slight scarring in the left lung base. Airways patent. Musculoskeletal: No chest wall mass or suspicious bone lesions identified. CT ABDOMEN PELVIS FINDINGS Hepatobiliary: Surgical absence of the gallbladder. No bile duct dilatation. Probable loculated subdiaphragmatic fluid collection surrounding the liver and measuring about 9 x 10.5 x 11.4 cm diameter. This may represent abscess or postoperative fluid collection. No gas is demonstrated within the collection. Small amount of fluid also demonstrated in the gallbladder fossa. No focal liver lesions. Portal veins appear patent. Pancreas: No mass, inflammatory changes, or other significant abnormality. Spleen: Within normal limits in size and appearance. Adrenals/Urinary Tract: No masses identified. No evidence of  hydronephrosis. Stomach/Bowel: Small esophageal hiatal hernia. Stomach and small bowel are decompressed. Contrast material flows through to the sigmoid colon suggesting no evidence of obstruction. Scattered stool in the colon. No bowel wall thickening or inflammatory changes suggested. Appendix is normal. Vascular/Lymphatic: No pathologically enlarged lymph nodes. No evidence of abdominal aortic aneurysm. Reproductive: Uterus and ovaries are not enlarged. No pelvic mass or lymphadenopathy. No free or loculated pelvic fluid collections. Other: No free air in the abdomen. Infiltration in the subcutaneous fat at the level of the umbilicus is likely postoperative. Musculoskeletal:  No suspicious bone lesions identified. IMPRESSION: Small right pleural effusion with atelectasis in the right lung base. Scattered bilateral pulmonary nodules, largest in the superior segment right lower lobe measures 9 mm. Recommend follow-up in 3 months after resolution of acute process. Large subdiaphragmatic fluid collections suggesting postoperative fluid collection versus abscess. No free air. No bile duct dilatation. Electronically Signed   By: Lucienne Capers M.D.   On: 03/22/2015 18:40   Ct Abdomen Pelvis W Contrast  03/22/2015  CLINICAL DATA:  Postoperative cholecystectomy 10 days ago. Increasing shortness of breath and cough since surgery. Low grade fever. EXAM: CT CHEST, ABDOMEN, AND PELVIS WITH CONTRAST TECHNIQUE: Multidetector CT imaging of the chest, abdomen and pelvis was performed following the standard protocol during bolus administration of intravenous contrast. CONTRAST:  70m OMNIPAQUE IOHEXOL 300 MG/ML  SOLN COMPARISON:  Ultrasound abdomen 11/2014 FINDINGS: CT CHEST FINDINGS Mediastinum/Lymph Nodes: No masses, pathologically enlarged lymph nodes, or other significant abnormality. Lungs/Pleura: Small right pleural effusion with atelectasis in the right lung base. Nodular opacity in the superior segment right lower  lobe adjacent to an area of atelectasis. The nodule measures about 9 mm diameter. Several additional scattered nodules are demonstrated bilaterally. Followup chest CT in 3 months after resolution of acute process is recommended to evaluate for persistence. No pneumothorax. Slight scarring in the left lung base. Airways patent. Musculoskeletal: No chest wall mass or suspicious bone lesions identified. CT ABDOMEN PELVIS FINDINGS Hepatobiliary: Surgical absence of the gallbladder. No bile duct dilatation. Probable loculated subdiaphragmatic fluid collection surrounding the liver and measuring about 9 x 10.5 x 11.4 cm diameter. This may represent abscess or postoperative fluid collection. No gas is demonstrated within the collection. Small amount of fluid also demonstrated in the gallbladder fossa. No focal liver lesions. Portal veins appear patent. Pancreas: No mass, inflammatory changes, or other significant abnormality. Spleen: Within normal limits in size and appearance. Adrenals/Urinary Tract: No masses identified. No evidence of hydronephrosis. Stomach/Bowel: Small esophageal hiatal hernia. Stomach and small bowel are decompressed.  Contrast material flows through to the sigmoid colon suggesting no evidence of obstruction. Scattered stool in the colon. No bowel wall thickening or inflammatory changes suggested. Appendix is normal. Vascular/Lymphatic: No pathologically enlarged lymph nodes. No evidence of abdominal aortic aneurysm. Reproductive: Uterus and ovaries are not enlarged. No pelvic mass or lymphadenopathy. No free or loculated pelvic fluid collections. Other: No free air in the abdomen. Infiltration in the subcutaneous fat at the level of the umbilicus is likely postoperative. Musculoskeletal:  No suspicious bone lesions identified. IMPRESSION: Small right pleural effusion with atelectasis in the right lung base. Scattered bilateral pulmonary nodules, largest in the superior segment right lower lobe  measures 9 mm. Recommend follow-up in 3 months after resolution of acute process. Large subdiaphragmatic fluid collections suggesting postoperative fluid collection versus abscess. No free air. No bile duct dilatation. Electronically Signed   By: Lucienne Capers M.D.   On: 03/22/2015 18:40    Medications / Allergies: per chart  Antibiotics: Anti-infectives    Start     Dose/Rate Route Frequency Ordered Stop   03/23/15 0700  cefTRIAXone (ROCEPHIN) 2 g in dextrose 5 % 50 mL IVPB    Comments:  Pharmacy may adjust dosing strength / duration / interval for maximal efficacy   2 g 100 mL/hr over 30 Minutes Intravenous Every 24 hours 03/23/15 0655     03/23/15 0700  metroNIDAZOLE (FLAGYL) IVPB 500 mg     500 mg 100 mL/hr over 60 Minutes Intravenous Every 6 hours 03/23/15 0655     03/22/15 1945  piperacillin-tazobactam (ZOSYN) IVPB 3.375 g     3.375 g 100 mL/hr over 30 Minutes Intravenous  Once 03/22/15 1935 03/22/15 2018      Assessment  Shelby Becker  59 y.o. female       Problem List:  Principal Problem:   Intra-abdominal fluid collection Active Problems:   Chronic cholecystitis with calculus s/p lap chole 03/12/2015   Difficulty breathing and right upper quadrant fluid collection after cholecystectomy.  Possible biloma or abscess or hematoma  Plan:  Admit.  IV fluids.  IV antibiotics.  Percutaneous drainage of collection.  If consistent with biloma was asked gastrology to evaluate and do ERCP with stenting.  -VTE prophylaxis- SCDs, etc  -mobilize as tolerated to help recovery    Adin Hector, M.D., F.A.C.S. Gastrointestinal and Minimally Invasive Surgery [facility] [address] [place], [place] [address] [phone] Main / Paging   03/23/2015  Note: Portions of this report may have been transcribed using voice recognition software. Every effort was made to ensure accuracy; however, inadvertent computerized  transcription errors may be present.   Any transcriptional errors that result from this process are unintentional.

## 2015-03-23 NOTE — Consult Note (Signed)
Chief Complaint: Patient was seen in consultation today for image guided drainage of right subdiaphragmatic fluid collection Chief Complaint  Patient presents with  . Fever  . Post-op Problem    Referring Physician(s): [facility] History of Present Illness: Shelby Becker is a 59 y.o. female , status post laparoscopic cholecystectomy on 03/12/15 for chronic cholecystitis, recently admitted with mild fever, dyspnea, right shoulder discomfort and cough as well as fatigue. Subsequent CT scan of the chest ,abdomen ,pelvis on 03/22/15 revealed small right pleural effusion with bilateral pulmonary nodules and a large subdiaphragmatic fluid collection concerning for abscess versus biloma. Request now received from surgery for image guided drainage of the above-mentioned collection.  Past Medical History  Diagnosis Date  . Depression     Tx'd in August 21, 1989 after a death of an infant  . Dyspareunia   . Hypertension   . Chronic cholecystitis with calculus s/p lap chole 03/12/2015 03/22/2015    Past Surgical History  Procedure Laterality Date  . Breast surgery  1994    benign cyst removed left breast  . Cesarean section  1991  . Laparoscopic cholecystectomy single site with intraoperative cholangiogram  03/12/2015    Dr Johney Maine  . Cholecystectomy      Allergies: Nsaids and Bee venom  Medications: Prior to Admission medications   Medication Sig Start Date End Date Taking? Authorizing Provider  acetaminophen (TYLENOL) 325 MG tablet Take 650 mg by mouth every 6 (six) hours as needed for moderate pain.    Yes Historical Provider, MD  dextromethorphan (DELSYM) 30 MG/5ML liquid Take 60 mg by mouth as needed for cough.   Yes Historical Provider, MD  EPIPEN 2-PAK 0.3 MG/0.3ML SOAJ injection Inject 0.3 mg into the muscle once. 12/30/14  Yes Historical Provider, MD  losartan (COZAAR) 100 MG tablet Take 100 mg by mouth daily.   Yes Historical Provider, MD  oxyCODONE (OXY IR/ROXICODONE) 5 MG immediate  release tablet Take 5 mg by mouth daily as needed. 03/12/15  Yes Historical Provider, MD  PEDIALYTE (PEDIALYTE) SOLN Take 240 mLs by mouth every 8 (eight) hours as needed (for minerals).   Yes Historical Provider, MD  PROAIR HFA 108 (90 BASE) MCG/ACT inhaler Inhale 2 puffs into the lungs daily as needed. 01/20/15  Yes Historical Provider, MD  Probiotic Product (PROBIOTIC DAILY) CAPS Take 1 capsule by mouth daily.   Yes Historical Provider, MD  estradiol (ESTRACE VAGINAL) 0.1 MG/GM vaginal cream Use 1/2 gram per vagina twice a week as needed. Patient not taking: Reported on 12/19/2014 10/09/13   Nunzio Cobbs, MD  HYDROcodone-acetaminophen (NORCO/VICODIN) 5-325 MG per tablet Take 1-2 tablets by mouth every 6 (six) hours as needed. 12/19/14   Glendell Docker, NP     Family History  Problem Relation Age of Onset  . Thyroid disease Mother     hypothyroid  . Hypertension Father   . Thyroid disease Sister     Hashimotos    Social History   Social History  . Marital Status: Single    Spouse Name: N/A  . Number of Children: N/A  . Years of Education: N/A   Social History Main Topics  . Smoking status: Former Smoker    Quit date: 05/01/1984  . Smokeless tobacco: None  . Alcohol Use: No     Comment: once weekly  . Drug Use: No  . Sexual Activity:    Partners: Male    Birth Control/ Protection: Post-menopausal   Other Topics Concern  .  None   Social History Narrative      Review of Systems  Constitutional: Positive for fever and fatigue.  Respiratory: Positive for cough and shortness of breath.   Cardiovascular: Negative for chest pain.  Gastrointestinal: Negative for nausea, vomiting and abdominal pain.  Genitourinary: Negative for dysuria and hematuria.  Musculoskeletal:       Right shoulder/scapular discomfort  Neurological: Positive for headaches.    Vital Signs: BP 146/78 mmHg  Pulse 114  Temp(Src) 98.1 F (36.7 C) (Oral)  Resp 22  Ht 5' 7.5" (1.715 m)   Wt 180 lb 12.8 oz (82.01 kg)  BMI 27.88 kg/m2  SpO2 98%  LMP 05/01/2004  Physical Exam  Constitutional: She is oriented to person, place, and time. She appears well-developed and well-nourished.  Cardiovascular: Regular rhythm.   Tachycardic but regular rhythm  Pulmonary/Chest: Effort normal.  Diminished breath sounds right base, left clear  Abdominal: Soft. Bowel sounds are normal. There is no tenderness.  Musculoskeletal: Normal range of motion. She exhibits no edema.  Neurological: She is alert and oriented to person, place, and time.    Mallampati Score:     Imaging: Dg Chest 2 View  03/22/2015  CLINICAL DATA:  59 year old female history of cough for the past 10 days following cholecystectomy. Elevated white blood cell count and new onset of fever. EXAM: CHEST  2 VIEW COMPARISON:  Chest x-ray 12/19/2014. FINDINGS: New elevation of the right hemidiaphragm. Linear opacity in the right lung base favored to reflect some passive subsegmental atelectasis. No acute consolidative airspace disease. No pleural effusions. No evidence of pulmonary edema. Heart size is normal. Upper mediastinal contours are within normal limits. IMPRESSION: 1. New elevation of the right hemidiaphragm compared to prior study from 12/19/2014. Given the patient's history of recent cholecystectomy, elevated white blood cell count and fever, the possibility of complications such as postcholecystectomy abscess or biloma should be considered, and further evaluation with contrast-enhanced CT of the abdomen and pelvis is suggested at this time. These results were called by telephone at the time of interpretation on 03/22/2015 at 2:54 pm to Dr. Tyrone Nine, who verbally acknowledged these results. Electronically Signed   By: Vinnie Langton M.D.   On: 03/22/2015 14:56   Ct Chest W Contrast  03/22/2015  CLINICAL DATA:  Postoperative cholecystectomy 10 days ago. Increasing shortness of breath and cough since surgery. Low grade  fever. EXAM: CT CHEST, ABDOMEN, AND PELVIS WITH CONTRAST TECHNIQUE: Multidetector CT imaging of the chest, abdomen and pelvis was performed following the standard protocol during bolus administration of intravenous contrast. CONTRAST:  52mL OMNIPAQUE IOHEXOL 300 MG/ML  SOLN COMPARISON:  Ultrasound abdomen 11/2014 FINDINGS: CT CHEST FINDINGS Mediastinum/Lymph Nodes: No masses, pathologically enlarged lymph nodes, or other significant abnormality. Lungs/Pleura: Small right pleural effusion with atelectasis in the right lung base. Nodular opacity in the superior segment right lower lobe adjacent to an area of atelectasis. The nodule measures about 9 mm diameter. Several additional scattered nodules are demonstrated bilaterally. Followup chest CT in 3 months after resolution of acute process is recommended to evaluate for persistence. No pneumothorax. Slight scarring in the left lung base. Airways patent. Musculoskeletal: No chest wall mass or suspicious bone lesions identified. CT ABDOMEN PELVIS FINDINGS Hepatobiliary: Surgical absence of the gallbladder. No bile duct dilatation. Probable loculated subdiaphragmatic fluid collection surrounding the liver and measuring about 9 x 10.5 x 11.4 cm diameter. This may represent abscess or postoperative fluid collection. No gas is demonstrated within the collection. Small amount of fluid also  demonstrated in the gallbladder fossa. No focal liver lesions. Portal veins appear patent. Pancreas: No mass, inflammatory changes, or other significant abnormality. Spleen: Within normal limits in size and appearance. Adrenals/Urinary Tract: No masses identified. No evidence of hydronephrosis. Stomach/Bowel: Small esophageal hiatal hernia. Stomach and small bowel are decompressed. Contrast material flows through to the sigmoid colon suggesting no evidence of obstruction. Scattered stool in the colon. No bowel wall thickening or inflammatory changes suggested. Appendix is normal.  Vascular/Lymphatic: No pathologically enlarged lymph nodes. No evidence of abdominal aortic aneurysm. Reproductive: Uterus and ovaries are not enlarged. No pelvic mass or lymphadenopathy. No free or loculated pelvic fluid collections. Other: No free air in the abdomen. Infiltration in the subcutaneous fat at the level of the umbilicus is likely postoperative. Musculoskeletal:  No suspicious bone lesions identified. IMPRESSION: Small right pleural effusion with atelectasis in the right lung base. Scattered bilateral pulmonary nodules, largest in the superior segment right lower lobe measures 9 mm. Recommend follow-up in 3 months after resolution of acute process. Large subdiaphragmatic fluid collections suggesting postoperative fluid collection versus abscess. No free air. No bile duct dilatation. Electronically Signed   By: Lucienne Capers M.D.   On: 03/22/2015 18:40   Ct Abdomen Pelvis W Contrast  03/22/2015  CLINICAL DATA:  Postoperative cholecystectomy 10 days ago. Increasing shortness of breath and cough since surgery. Low grade fever. EXAM: CT CHEST, ABDOMEN, AND PELVIS WITH CONTRAST TECHNIQUE: Multidetector CT imaging of the chest, abdomen and pelvis was performed following the standard protocol during bolus administration of intravenous contrast. CONTRAST:  74mL OMNIPAQUE IOHEXOL 300 MG/ML  SOLN COMPARISON:  Ultrasound abdomen 11/2014 FINDINGS: CT CHEST FINDINGS Mediastinum/Lymph Nodes: No masses, pathologically enlarged lymph nodes, or other significant abnormality. Lungs/Pleura: Small right pleural effusion with atelectasis in the right lung base. Nodular opacity in the superior segment right lower lobe adjacent to an area of atelectasis. The nodule measures about 9 mm diameter. Several additional scattered nodules are demonstrated bilaterally. Followup chest CT in 3 months after resolution of acute process is recommended to evaluate for persistence. No pneumothorax. Slight scarring in the left lung  base. Airways patent. Musculoskeletal: No chest wall mass or suspicious bone lesions identified. CT ABDOMEN PELVIS FINDINGS Hepatobiliary: Surgical absence of the gallbladder. No bile duct dilatation. Probable loculated subdiaphragmatic fluid collection surrounding the liver and measuring about 9 x 10.5 x 11.4 cm diameter. This may represent abscess or postoperative fluid collection. No gas is demonstrated within the collection. Small amount of fluid also demonstrated in the gallbladder fossa. No focal liver lesions. Portal veins appear patent. Pancreas: No mass, inflammatory changes, or other significant abnormality. Spleen: Within normal limits in size and appearance. Adrenals/Urinary Tract: No masses identified. No evidence of hydronephrosis. Stomach/Bowel: Small esophageal hiatal hernia. Stomach and small bowel are decompressed. Contrast material flows through to the sigmoid colon suggesting no evidence of obstruction. Scattered stool in the colon. No bowel wall thickening or inflammatory changes suggested. Appendix is normal. Vascular/Lymphatic: No pathologically enlarged lymph nodes. No evidence of abdominal aortic aneurysm. Reproductive: Uterus and ovaries are not enlarged. No pelvic mass or lymphadenopathy. No free or loculated pelvic fluid collections. Other: No free air in the abdomen. Infiltration in the subcutaneous fat at the level of the umbilicus is likely postoperative. Musculoskeletal:  No suspicious bone lesions identified. IMPRESSION: Small right pleural effusion with atelectasis in the right lung base. Scattered bilateral pulmonary nodules, largest in the superior segment right lower lobe measures 9 mm. Recommend follow-up in 3 months after resolution  of acute process. Large subdiaphragmatic fluid collections suggesting postoperative fluid collection versus abscess. No free air. No bile duct dilatation. Electronically Signed   By: Lucienne Capers M.D.   On: 03/22/2015 18:40     Labs:  CBC:  Recent Labs  12/19/14 1600 03/22/15 1412  WBC 7.6 18.1*  HGB 14.4 13.8  HCT 43.4 40.9  PLT 185 372    COAGS: No results for input(s): INR, APTT in the last 8760 hours.  BMP:  Recent Labs  12/19/14 1600 03/22/15 1412  NA 141 137  K 4.0 4.6  CL 109 102  CO2 22 25  GLUCOSE 129* 122*  BUN 16 12  CALCIUM 9.6 9.5  CREATININE 1.08* 1.31*  GFRNONAA 55* 43*  GFRAA >60 50*    LIVER FUNCTION TESTS:  Recent Labs  12/19/14 1600 03/22/15 1412  BILITOT 0.7 1.7*  AST 24 174*  ALT 24 365*  ALKPHOS 95 495*  PROT 6.9 7.8  ALBUMIN 4.2 3.1*    TUMOR MARKERS: No results for input(s): AFPTM, CEA, CA199, CHROMGRNA in the last 8760 hours.  Assessment and Plan: 59 y.o. female , status post laparoscopic cholecystectomy on 03/12/15 for chronic cholecystitis, recently admitted with mild fever, dyspnea, right shoulder discomfort and cough as well as fatigue. Subsequent CT scan of the chest ,abdomen ,pelvis on 03/22/15 revealed small right pleural effusion with bilateral pulmonary nodules and a large subdiaphragmatic fluid collection concerning for abscess versus biloma. Request now received from surgery for image guided drainage of the above-mentioned collection. Imaging studies have been reviewed by Dr. Earleen Newport. Patient currently afebrile,  WBC on 11/21 was 18.1 , blood and urine cultures negative to date. LFTs elevated. Plan is for ultrasound-guided drainage of the subdiaphragmatic fluid collection today. Details/risks of procedure, including but not limited to, internal bleeding, infection, injury to adjacent structures, discussed with patient and husband with their understanding and consent.   Thank you for this interesting consult.  I greatly enjoyed meeting Shelby Becker and look forward to participating in their care.  A copy of this report was sent to the requesting provider on this date.  Signed: D. Rowe Robert 03/23/2015, 12:45 PM   I spent a total of 20  minutes in face to face in clinical consultation, greater than 50% of which was counseling/coordinating care for ultrasound guided drainage of subdiaphragmatic fluid collection

## 2015-03-24 LAB — COMPREHENSIVE METABOLIC PANEL
ALT: 153 U/L — ABNORMAL HIGH (ref 14–54)
AST: 33 U/L (ref 15–41)
Albumin: 2.5 g/dL — ABNORMAL LOW (ref 3.5–5.0)
Alkaline Phosphatase: 312 U/L — ABNORMAL HIGH (ref 38–126)
Anion gap: 7 (ref 5–15)
BUN: 12 mg/dL (ref 6–20)
CO2: 24 mmol/L (ref 22–32)
Calcium: 8.7 mg/dL — ABNORMAL LOW (ref 8.9–10.3)
Chloride: 105 mmol/L (ref 101–111)
Creatinine, Ser: 0.96 mg/dL (ref 0.44–1.00)
GFR calc Af Amer: >60 mL/min (ref >60)
GFR calc non Af Amer: >60 mL/min (ref >60)
Glucose, Bld: 105 mg/dL — ABNORMAL HIGH (ref 65–99)
Potassium: 4.7 mmol/L (ref 3.5–5.1)
Sodium: 136 mmol/L (ref 135–145)
Total Bilirubin: 1.1 mg/dL (ref 0.3–1.2)
Total Protein: 5.8 g/dL — ABNORMAL LOW (ref 6.5–8.1)

## 2015-03-24 LAB — CBC
HCT: 34.1 % — ABNORMAL LOW (ref 36.0–46.0)
Hemoglobin: 11.2 g/dL — ABNORMAL LOW (ref 12.0–15.0)
MCH: 30.8 pg (ref 26.0–34.0)
MCHC: 32.8 g/dL (ref 30.0–36.0)
MCV: 93.7 fL (ref 78.0–100.0)
Platelets: 344 10*3/uL (ref 150–400)
RBC: 3.64 MIL/uL — ABNORMAL LOW (ref 3.87–5.11)
RDW: 14.5 % (ref 11.5–15.5)
WBC: 21.2 10*3/uL — ABNORMAL HIGH (ref 4.0–10.5)

## 2015-03-24 LAB — URINE CULTURE

## 2015-03-24 LAB — AMYLASE: Amylase: 35 U/L (ref 28–100)

## 2015-03-24 MED ORDER — SODIUM CHLORIDE 0.9 % IJ SOLN: 3.0000 mL | INTRAMUSCULAR | Status: DC | PRN

## 2015-03-24 MED ORDER — SODIUM CHLORIDE 0.9 % IV SOLN: 250.0000 mL | INTRAVENOUS | Status: DC | PRN

## 2015-03-24 MED ORDER — ACETAMINOPHEN 500 MG PO TABS
1000.0000 mg | ORAL_TABLET | Freq: Three times a day (TID) | ORAL | Status: DC
  Administered 2015-03-24 – 2015-03-25 (×5): 1000 mg via ORAL
  Filled 2015-03-24 (×8): qty 2

## 2015-03-24 MED ORDER — GUAIFENESIN-DM 100-10 MG/5ML PO SYRP
15.0000 mL | ORAL_SOLUTION | Freq: Once | ORAL | Status: AC
  Administered 2015-03-24: 15 mL via ORAL
  Filled 2015-03-24: qty 15

## 2015-03-24 MED ORDER — TRAMADOL HCL 50 MG PO TABS: 50.0000 mg | ORAL_TABLET | Freq: Four times a day (QID) | ORAL | Status: DC | PRN

## 2015-03-24 MED ORDER — GUAIFENESIN 100 MG/5ML PO SOLN
5.0000 mL | ORAL | Status: DC | PRN
  Administered 2015-03-24 – 2015-03-25 (×2): 100 mg via ORAL
  Filled 2015-03-24 (×2): qty 10

## 2015-03-24 MED ORDER — FENTANYL CITRATE (PF) 100 MCG/2ML IJ SOLN
25.0000 ug | INTRAMUSCULAR | Status: DC | PRN
  Administered 2015-03-24 – 2015-03-25 (×2): 50 ug via INTRAVENOUS
  Filled 2015-03-24 (×2): qty 2

## 2015-03-24 MED ORDER — SODIUM CHLORIDE 0.9 % IJ SOLN
3.0000 mL | Freq: Two times a day (BID) | INTRAMUSCULAR | Status: DC
  Administered 2015-03-24 – 2015-03-25 (×2): 3 mL via INTRAVENOUS

## 2015-03-24 MED ORDER — ALBUTEROL SULFATE (2.5 MG/3ML) 0.083% IN NEBU: 2.5000 mg | INHALATION_SOLUTION | RESPIRATORY_TRACT | Status: DC | PRN

## 2015-03-24 NOTE — Progress Notes (Signed)
Referring Physician(s): CCS  Chief Complaint: RUQ fluid collection s/p perc drain 11/22 by IR  Subjective: Patient states she has tenderness at RUQ drain site, but admits to improved abdominal distention and improved deep inspiration. She states she has a good appetite and is going to attempt solid diet today.   Allergies: Nsaids and Bee venom  Medications: Prior to Admission medications   Medication Sig Start Date End Date Taking? Authorizing Provider  acetaminophen (TYLENOL) 325 MG tablet Take 650 mg by mouth every 6 (six) hours as needed for moderate pain.    Yes Historical Provider, MD  dextromethorphan (DELSYM) 30 MG/5ML liquid Take 60 mg by mouth as needed for cough.   Yes Historical Provider, MD  EPIPEN 2-PAK 0.3 MG/0.3ML SOAJ injection Inject 0.3 mg into the muscle once. 12/30/14  Yes Historical Provider, MD  losartan (COZAAR) 100 MG tablet Take 100 mg by mouth daily.   Yes Historical Provider, MD  PEDIALYTE (PEDIALYTE) SOLN Take 240 mLs by mouth every 8 (eight) hours as needed (for minerals).   Yes Historical Provider, MD  PROAIR HFA 108 (90 BASE) MCG/ACT inhaler Inhale 2 puffs into the lungs daily as needed. 01/20/15  Yes Historical Provider, MD  Probiotic Product (PROBIOTIC DAILY) CAPS Take 1 capsule by mouth daily.   Yes Historical Provider, MD  estradiol (ESTRACE VAGINAL) 0.1 MG/GM vaginal cream Use 1/2 gram per vagina twice a week as needed. Patient not taking: Reported on 12/19/2014 10/09/13   Nunzio Cobbs, MD   Vital Signs: BP 109/67 mmHg  Pulse 101  Temp(Src) 99.5 F (37.5 C) (Oral)  Resp 20  Ht 5' 7.5" (1.715 m)  Wt 180 lb 12.8 oz (82.01 kg)  BMI 27.88 kg/m2  SpO2 98%  LMP 05/01/2004  Physical Exam General: A&Ox3, NAD Abd: Soft, tenderness at drain site, ND, good bilious output in bag, 300 cc/24 hrs  Imaging: Dg Chest 2 View  03/22/2015  CLINICAL DATA:  59 year old female history of cough for the past 10 days following cholecystectomy.  Elevated white blood cell count and new onset of fever. EXAM: CHEST  2 VIEW COMPARISON:  Chest x-ray 12/19/2014. FINDINGS: New elevation of the right hemidiaphragm. Linear opacity in the right lung base favored to reflect some passive subsegmental atelectasis. No acute consolidative airspace disease. No pleural effusions. No evidence of pulmonary edema. Heart size is normal. Upper mediastinal contours are within normal limits. IMPRESSION: 1. New elevation of the right hemidiaphragm compared to prior study from 12/19/2014. Given the patient's history of recent cholecystectomy, elevated white blood cell count and fever, the possibility of complications such as postcholecystectomy abscess or biloma should be considered, and further evaluation with contrast-enhanced CT of the abdomen and pelvis is suggested at this time. These results were called by telephone at the time of interpretation on 03/22/2015 at 2:54 pm to Dr. Tyrone Nine, who verbally acknowledged these results. Electronically Signed   By: Vinnie Langton M.D.   On: 03/22/2015 14:56   Ct Chest W Contrast  03/22/2015  CLINICAL DATA:  Postoperative cholecystectomy 10 days ago. Increasing shortness of breath and cough since surgery. Low grade fever. EXAM: CT CHEST, ABDOMEN, AND PELVIS WITH CONTRAST TECHNIQUE: Multidetector CT imaging of the chest, abdomen and pelvis was performed following the standard protocol during bolus administration of intravenous contrast. CONTRAST:  45mL OMNIPAQUE IOHEXOL 300 MG/ML  SOLN COMPARISON:  Ultrasound abdomen 11/2014 FINDINGS: CT CHEST FINDINGS Mediastinum/Lymph Nodes: No masses, pathologically enlarged lymph nodes, or other significant abnormality. Lungs/Pleura: Small  right pleural effusion with atelectasis in the right lung base. Nodular opacity in the superior segment right lower lobe adjacent to an area of atelectasis. The nodule measures about 9 mm diameter. Several additional scattered nodules are demonstrated bilaterally.  Followup chest CT in 3 months after resolution of acute process is recommended to evaluate for persistence. No pneumothorax. Slight scarring in the left lung base. Airways patent. Musculoskeletal: No chest wall mass or suspicious bone lesions identified. CT ABDOMEN PELVIS FINDINGS Hepatobiliary: Surgical absence of the gallbladder. No bile duct dilatation. Probable loculated subdiaphragmatic fluid collection surrounding the liver and measuring about 9 x 10.5 x 11.4 cm diameter. This may represent abscess or postoperative fluid collection. No gas is demonstrated within the collection. Small amount of fluid also demonstrated in the gallbladder fossa. No focal liver lesions. Portal veins appear patent. Pancreas: No mass, inflammatory changes, or other significant abnormality. Spleen: Within normal limits in size and appearance. Adrenals/Urinary Tract: No masses identified. No evidence of hydronephrosis. Stomach/Bowel: Small esophageal hiatal hernia. Stomach and small bowel are decompressed. Contrast material flows through to the sigmoid colon suggesting no evidence of obstruction. Scattered stool in the colon. No bowel wall thickening or inflammatory changes suggested. Appendix is normal. Vascular/Lymphatic: No pathologically enlarged lymph nodes. No evidence of abdominal aortic aneurysm. Reproductive: Uterus and ovaries are not enlarged. No pelvic mass or lymphadenopathy. No free or loculated pelvic fluid collections. Other: No free air in the abdomen. Infiltration in the subcutaneous fat at the level of the umbilicus is likely postoperative. Musculoskeletal:  No suspicious bone lesions identified. IMPRESSION: Small right pleural effusion with atelectasis in the right lung base. Scattered bilateral pulmonary nodules, largest in the superior segment right lower lobe measures 9 mm. Recommend follow-up in 3 months after resolution of acute process. Large subdiaphragmatic fluid collections suggesting postoperative fluid  collection versus abscess. No free air. No bile duct dilatation. Electronically Signed   By: Lucienne Capers M.D.   On: 03/22/2015 18:40   [place] Abscess Drain  03/23/2015  CLINICAL DATA:  59 year old female with a history of lap coli with postoperative fluid collection in the subdiaphragmatic region. She has been referred for drainage. EXAM: ULTRASOUND GUIDED ABSCESS DRAINAGE MEDICATIONS: 2.0 mg Versed, 0 mcg fentanyl COMPARISON:  CT 03/22/2015 PROCEDURE: The procedure, risks, benefits, and alternatives were explained to the patient. Questions regarding the procedure were encouraged and answered. The patient understands and consents to the procedure. Ultrasound survey of the right upper quadrant was performed. Images were stored and sent to PACs. The right upper quadrant anterior wall was prepped with Betadine in a sterile fashion, and a sterile drape was applied covering the operative field. A sterile gown and sterile gloves were used for the procedure. Local anesthesia was provided with 1% Lidocaine. Once the patient is prepped and draped in the usual sterile fashion, the skin and subcutaneous tissues were generously infiltrated with 1% lidocaine for local anesthesia. A small stab incision was made with 11 blade scalpel. Trocar technique was used to advance a 10 [place] drain into the subdiaphragmatic collection. Bilious drainage was returned. Approximately 600 cc of dark fluid aspirated. Sample was sent to the lab. Pigtail catheter was formed and a final image was stored. Patient tolerated the procedure well and remained hemodynamically stable throughout. No complications encountered and no significant blood loss encountered. Drain was sutured into position and left to gravity drainage. FINDINGS: Ultrasound survey demonstrates subdiaphragmatic fluid collection. Approximately 600 cc of fluid aspirated. IMPRESSION: Status post ultrasound-guided drainage of sub diaphragmatic fluid  collection. Sample sent to the  lab. Signed, Dulcy Fanny. Earleen Newport, DO [facility] Electronically Signed   By: Corrie Mckusick D.O.   On: 03/23/2015 18:04   Ct Abdomen Pelvis W Contrast  03/22/2015  CLINICAL DATA:  Postoperative cholecystectomy 10 days ago. Increasing shortness of breath and cough since surgery. Low grade fever. EXAM: CT CHEST, ABDOMEN, AND PELVIS WITH CONTRAST TECHNIQUE: Multidetector CT imaging of the chest, abdomen and pelvis was performed following the standard protocol during bolus administration of intravenous contrast. CONTRAST:  49mL OMNIPAQUE IOHEXOL 300 MG/ML  SOLN COMPARISON:  Ultrasound abdomen 11/2014 FINDINGS: CT CHEST FINDINGS Mediastinum/Lymph Nodes: No masses, pathologically enlarged lymph nodes, or other significant abnormality. Lungs/Pleura: Small right pleural effusion with atelectasis in the right lung base. Nodular opacity in the superior segment right lower lobe adjacent to an area of atelectasis. The nodule measures about 9 mm diameter. Several additional scattered nodules are demonstrated bilaterally. Followup chest CT in 3 months after resolution of acute process is recommended to evaluate for persistence. No pneumothorax. Slight scarring in the left lung base. Airways patent. Musculoskeletal: No chest wall mass or suspicious bone lesions identified. CT ABDOMEN PELVIS FINDINGS Hepatobiliary: Surgical absence of the gallbladder. No bile duct dilatation. Probable loculated subdiaphragmatic fluid collection surrounding the liver and measuring about 9 x 10.5 x 11.4 cm diameter. This may represent abscess or postoperative fluid collection. No gas is demonstrated within the collection. Small amount of fluid also demonstrated in the gallbladder fossa. No focal liver lesions. Portal veins appear patent. Pancreas: No mass, inflammatory changes, or other significant abnormality. Spleen: Within normal limits in size and appearance. Adrenals/Urinary Tract: No  masses identified. No evidence of hydronephrosis. Stomach/Bowel: Small esophageal hiatal hernia. Stomach and small bowel are decompressed. Contrast material flows through to the sigmoid colon suggesting no evidence of obstruction. Scattered stool in the colon. No bowel wall thickening or inflammatory changes suggested. Appendix is normal. Vascular/Lymphatic: No pathologically enlarged lymph nodes. No evidence of abdominal aortic aneurysm. Reproductive: Uterus and ovaries are not enlarged. No pelvic mass or lymphadenopathy. No free or loculated pelvic fluid collections. Other: No free air in the abdomen. Infiltration in the subcutaneous fat at the level of the umbilicus is likely postoperative. Musculoskeletal:  No suspicious bone lesions identified. IMPRESSION: Small right pleural effusion with atelectasis in the right lung base. Scattered bilateral pulmonary nodules, largest in the superior segment right lower lobe measures 9 mm. Recommend follow-up in 3 months after resolution of acute process. Large subdiaphragmatic fluid collections suggesting postoperative fluid collection versus abscess. No free air. No bile duct dilatation. Electronically Signed   By: Lucienne Capers M.D.   On: 03/22/2015 18:40    Labs:  CBC:  Recent Labs  12/19/14 1600 03/22/15 1412 03/24/15 0531  WBC 7.6 18.1* 21.2*  HGB 14.4 13.8 11.2*  HCT 43.4 40.9 34.1*  PLT 185 372 344    COAGS:  Recent Labs  03/23/15 1350  INR 1.14    BMP:  Recent Labs  12/19/14 1600 03/22/15 1412 03/24/15 0531  NA 141 137 136  K 4.0 4.6 4.7  CL 109 102 105  CO2 22 25 24   GLUCOSE 129* 122* 105*  BUN 16 12 12   CALCIUM 9.6 9.5 8.7*  CREATININE 1.08* 1.31* 0.96  GFRNONAA 55* 43* >60  GFRAA >60 50* >60    LIVER FUNCTION TESTS:  Recent Labs  12/19/14 1600 03/22/15 1412 03/24/15 0531  BILITOT 0.7 1.7* 1.1  AST 24 174* 33  ALT 24 365* 153*  ALKPHOS 95 495* 312*  PROT 6.9 7.8 5.8*  ALBUMIN 4.2 3.1* 2.5*     Assessment and Plan: S/p Lap Cholecystectomy 03/12/15 for chronic cholecystitis  RUQ fluid collection s/p perc drain 11/22- bilious output Wbc slightly up and H/H down- follow, no blood in drain output, symptoms improved, Cx pending Plans per CCS   Signed: Tsosie Billing D 03/24/2015, 11:13 AM   I spent a total of 15 Minutes at the the patient's bedside AND on the patient's hospital floor or unit, greater than 50% of which was counseling/coordinating care for biloma

## 2015-03-24 NOTE — Progress Notes (Signed)
Patient up and walking halls with husband, tolerated walk well.  Report called to 5th floor nurse, all questions answered.

## 2015-03-25 LAB — AMYLASE, PERITONEAL FLUID: Amylase, peritoneal fluid: 70 U/L

## 2015-03-25 MED ORDER — SACCHAROMYCES BOULARDII 250 MG PO CAPS
250.0000 mg | ORAL_CAPSULE | Freq: Two times a day (BID) | ORAL | Status: DC
  Administered 2015-03-25 (×2): 250 mg via ORAL
  Filled 2015-03-25 (×4): qty 1

## 2015-03-25 MED ORDER — HEPARIN SODIUM (PORCINE) 5000 UNIT/ML IJ SOLN
5000.0000 [IU] | Freq: Three times a day (TID) | INTRAMUSCULAR | Status: DC
  Administered 2015-03-25 – 2015-03-26 (×4): 5000 [IU] via SUBCUTANEOUS
  Filled 2015-03-25 (×7): qty 1

## 2015-03-25 NOTE — Progress Notes (Signed)
General Surgery Note  LOS: 3 days  POD -     Assessment/Plan: 1.  Bile leak - Perc drain - 03/23/2015  WBC - 21,200 - 03/24/2015  Rocephin/Flagyl - 11/22 >>>  300 cc recorded last 24 hours.  Cultures still pending  2.  Cough  Better, but still has this 3.  Loose stools  Will start Florastor  4.  Lap chole - 03/12/2015 - Gross  5.  Depression 6.  HTN 7.  Right lung nodule on CT scan - will need follow up  Copy of report given to patient  8.  DVT prophylaxis - to start SQ Heparin  Principal Problem:   Intra-abdominal fluid collection s/p perc drainage 03/23/2015 Active Problems:   Chronic cholecystitis with calculus s/p lap chole 03/12/2015   Hypertension  Subjective:  Doing better, but has continued cough.  Husband in room.  They have a daughter that works at [employer] who lives in [place]. Objective:   Filed Vitals:   03/25/15 0229 03/25/15 0554  BP: 96/55 134/86  Pulse: 86 86  Temp: 99.3 F (37.4 C) 97.9 F (36.6 C)  Resp: 18 18     Intake/Output from previous day:  11/23 0701 - 11/24 0700 In: 6 [P.O.:720; IV Piggyback:200] Out: 601 [Urine:301; Drains:300]  Intake/Output this shift:      Physical Exam:   General: WN older WF who is alert and oriented.    HEENT: Normal. Pupils equal. .   Lungs: Clear.  Slightly decreased breath sounds in left base   Abdomen: Soft   Wound: Drain in RUQ   Lab Results:    Recent Labs  03/22/15 1412 03/24/15 0531  WBC 18.1* 21.2*  HGB 13.8 11.2*  HCT 40.9 34.1*  PLT 372 344    BMET   Recent Labs  03/22/15 1412 03/24/15 0531  NA 137 136  K 4.6 4.7  CL 102 105  CO2 25 24  GLUCOSE 122* 105*  BUN 12 12  CREATININE 1.31* 0.96  CALCIUM 9.5 8.7*    PT/INR   Recent Labs  03/23/15 1350  LABPROT 14.8  INR 1.14    ABG  No results for input(s): PHART, HCO3 in the last 72 hours.  Invalid input(s): PCO2, PO2   Studies/Results:  [place] Abscess Drain  03/23/2015  CLINICAL DATA:  59 year old female with a history  of lap coli with postoperative fluid collection in the subdiaphragmatic region. She has been referred for drainage. EXAM: ULTRASOUND GUIDED ABSCESS DRAINAGE MEDICATIONS: 2.0 mg Versed, 0 mcg fentanyl COMPARISON:  CT 03/22/2015 PROCEDURE: The procedure, risks, benefits, and alternatives were explained to the patient. Questions regarding the procedure were encouraged and answered. The patient understands and consents to the procedure. Ultrasound survey of the right upper quadrant was performed. Images were stored and sent to PACs. The right upper quadrant anterior wall was prepped with Betadine in a sterile fashion, and a sterile drape was applied covering the operative field. A sterile gown and sterile gloves were used for the procedure. Local anesthesia was provided with 1% Lidocaine. Once the patient is prepped and draped in the usual sterile fashion, the skin and subcutaneous tissues were generously infiltrated with 1% lidocaine for local anesthesia. A small stab incision was made with 11 blade scalpel. Trocar technique was used to advance a 10 [place] drain into the subdiaphragmatic collection. Bilious drainage was returned. Approximately 600 cc of dark fluid aspirated. Sample was sent to the lab. Pigtail catheter was formed and a final image was stored.  Patient tolerated the procedure well and remained hemodynamically stable throughout. No complications encountered and no significant blood loss encountered. Drain was sutured into position and left to gravity drainage. FINDINGS: Ultrasound survey demonstrates subdiaphragmatic fluid collection. Approximately 600 cc of fluid aspirated. IMPRESSION: Status post ultrasound-guided drainage of sub diaphragmatic fluid collection. Sample sent to the lab. Signed, Dulcy Fanny. Earleen Newport, DO [facility] Electronically Signed   By: Corrie Mckusick D.O.   On: 03/23/2015 18:04     Anti-infectives:   Anti-infectives    Start      Dose/Rate Route Frequency Ordered Stop   03/23/15 0800  metroNIDAZOLE (FLAGYL) IVPB 500 mg     500 mg 100 mL/hr over 60 Minutes Intravenous Every 6 hours 03/23/15 0655     03/23/15 0730  cefTRIAXone (ROCEPHIN) 2 g in dextrose 5 % 50 mL IVPB    Comments:  Pharmacy may adjust dosing strength / duration / interval for maximal efficacy   2 g 100 mL/hr over 30 Minutes Intravenous Every 24 hours 03/23/15 0655     03/22/15 1945  piperacillin-tazobactam (ZOSYN) IVPB 3.375 g     3.375 g 100 mL/hr over 30 Minutes Intravenous  Once 03/22/15 1935 03/22/15 2018      Alphonsa Overall, MD, FACS Pager: [facility] Office: [phone] 03/25/2015

## 2015-03-26 LAB — CBC WITH DIFFERENTIAL/PLATELET
Basophils Absolute: 0 10*3/uL (ref 0.0–0.1)
Basophils Relative: 0 %
Eosinophils Absolute: 0.5 10*3/uL (ref 0.0–0.7)
Eosinophils Relative: 5 %
HCT: 34.7 % — ABNORMAL LOW (ref 36.0–46.0)
Hemoglobin: 11.7 g/dL — ABNORMAL LOW (ref 12.0–15.0)
Lymphocytes Relative: 13 %
Lymphs Abs: 1.1 10*3/uL (ref 0.7–4.0)
MCH: 30.9 pg (ref 26.0–34.0)
MCHC: 33.7 g/dL (ref 30.0–36.0)
MCV: 91.6 fL (ref 78.0–100.0)
Monocytes Absolute: 0.6 10*3/uL (ref 0.1–1.0)
Monocytes Relative: 7 %
Neutro Abs: 6.4 10*3/uL (ref 1.7–7.7)
Neutrophils Relative %: 75 %
Platelets: 409 10*3/uL — ABNORMAL HIGH (ref 150–400)
RBC: 3.79 MIL/uL — ABNORMAL LOW (ref 3.87–5.11)
RDW: 13.8 % (ref 11.5–15.5)
WBC: 8.6 10*3/uL (ref 4.0–10.5)

## 2015-03-26 MED ORDER — AMOXICILLIN-POT CLAVULANATE 875-125 MG PO TABS: 1.0000 | ORAL_TABLET | Freq: Two times a day (BID) | ORAL | Status: DC

## 2015-03-26 MED ORDER — SACCHAROMYCES BOULARDII 250 MG PO CAPS: 250.0000 mg | ORAL_CAPSULE | Freq: Two times a day (BID) | ORAL | Status: DC

## 2015-03-26 NOTE — Progress Notes (Signed)
Discharge instructions given questions answered.  Patient and husband taught how to empty and record drainage tube. Teach back provided.   Instructed to take output log to office for physician review.  States understanding.

## 2015-03-26 NOTE — Discharge Summary (Signed)
Physician Discharge Summary  Patient ID:  Shelby Becker  MRN: [id number]  DOB/AGE: August 21, 1955 59 y.o.  Admit date: 03/22/2015 Discharge date: 03/26/2015  Discharge Diagnoses:  1.  Bile leak - RUQ Perc drain - 03/23/2015 Cultures negative so far  2. Cough Better, but still has this 3. Lap chole - 03/12/2015 - Gross  4. Depression 5. HTN 6. Right lung nodule on CT scan - will need follow up Copy of report given to patient   Principal Problem:   Intra-abdominal fluid collection s/p perc drainage 03/23/2015 Active Problems:   Chronic cholecystitis with calculus s/p lap chole 03/12/2015   Hypertension   Operation:  on * No surgery found *  Discharged Condition: good  Hospital Course: Shelby Becker is an 60 y.o. female whose primary care physician is Gennette Pac, MD and who was admitted 03/22/2015 with a chief complaint of  Chief Complaint  Patient presents with  . Fever  . Post-op Problem  .   She was found to have a right subdiaphragmatic fluid collection on an abdominal CT scan on 03/22/2015.  She underwent a perc drain on 03/23/2015.  Cultures have so far been negative.  The perc drain drained 120 cc of bile colored fluid the last 24 hours.   Her WBC has returned to normal.  She still has a significant cough, but this is getting better.  She gets nauseated with her coughing fits, but is keeping liquids down.  She wants to go home.   Her husband is in the room with her.  The discharge instructions were reviewed with the patient.  Consults: None  Significant Diagnostic Studies: Results for orders placed or performed during the hospital encounter of 03/22/15  Culture, blood (routine x 2)  Result Value Ref Range   Specimen Description BLOOD LEFT ANTECUBITAL    Special Requests BOTTLES DRAWN AEROBIC AND ANAEROBIC 5CC    Culture NO GROWTH 3 DAYS    Report Status PENDING   Culture, blood (routine x 2)  Result Value Ref  Range   Specimen Description BLOOD RIGHT HAND    Special Requests BOTTLES DRAWN AEROBIC ONLY 1CC    Culture NO GROWTH 3 DAYS    Report Status PENDING   Urine culture  Result Value Ref Range   Specimen Description URINE, CLEAN CATCH    Special Requests NONE    Culture MULTIPLE SPECIES PRESENT, SUGGEST RECOLLECTION    Report Status 03/24/2015 FINAL   Culture, routine-abscess  Result Value Ref Range   Specimen Description ABSCESS SUBDIAPHRAGMATIC FLUID, BILIOUS FLUID    Special Requests NONE    Gram Stain      ABUNDANT WBC PRESENT,BOTH PMN AND MONONUCLEAR NO SQUAMOUS EPITHELIAL CELLS SEEN NO ORGANISMS SEEN Performed at [facility]    Culture      NO GROWTH 2 DAYS Performed at [facility]    Report Status PENDING   Comprehensive metabolic panel  Result Value Ref Range   Sodium 137 135 - 145 mmol/L   Potassium 4.6 3.5 - 5.1 mmol/L   Chloride 102 101 - 111 mmol/L   CO2 25 22 - 32 mmol/L   Glucose, Bld 122 (H) 65 - 99 mg/dL   BUN 12 6 - 20 mg/dL   Creatinine, Ser 1.31 (H) 0.44 - 1.00 mg/dL   Calcium 9.5 8.9 - 10.3 mg/dL   Total Protein 7.8 6.5 - 8.1 g/dL   Albumin 3.1 (L) 3.5 - 5.0 g/dL   AST 174 (H) 15 - 41  U/L   ALT 365 (H) 14 - 54 U/L   Alkaline Phosphatase 495 (H) 38 - 126 U/L   Total Bilirubin 1.7 (H) 0.3 - 1.2 mg/dL   GFR calc non Af Amer 43 (L) >60 mL/min   GFR calc Af Amer 50 (L) >60 mL/min   Anion gap 10 5 - 15  Urinalysis, Routine w reflex microscopic (not at [facility])  Result Value Ref Range   Color, Urine YELLOW YELLOW   APPearance HAZY (A) CLEAR   Specific Gravity, Urine 1.006 1.005 - 1.030   pH 6.0 5.0 - 8.0   Glucose, UA NEGATIVE NEGATIVE mg/dL   Hgb urine dipstick NEGATIVE NEGATIVE   Bilirubin Urine NEGATIVE NEGATIVE   Ketones, ur NEGATIVE NEGATIVE mg/dL   Protein, ur NEGATIVE NEGATIVE mg/dL   Nitrite NEGATIVE NEGATIVE   Leukocytes, UA MODERATE (A) NEGATIVE  CBC with Differential  Result Value Ref Range   WBC 18.1 (H) 4.0 - 10.5 K/uL    RBC 4.43 3.87 - 5.11 MIL/uL   Hemoglobin 13.8 12.0 - 15.0 g/dL   HCT 40.9 36.0 - 46.0 %   MCV 92.3 78.0 - 100.0 fL   MCH 31.2 26.0 - 34.0 pg   MCHC 33.7 30.0 - 36.0 g/dL   RDW 14.2 11.5 - 15.5 %   Platelets 372 150 - 400 K/uL   Neutrophils Relative % 75 %   Neutro Abs 13.8 (H) 1.7 - 7.7 K/uL   Lymphocytes Relative 11 %   Lymphs Abs 1.9 0.7 - 4.0 K/uL   Monocytes Relative 11 %   Monocytes Absolute 1.9 (H) 0.1 - 1.0 K/uL   Eosinophils Relative 3 %   Eosinophils Absolute 0.5 0.0 - 0.7 K/uL   Basophils Relative 0 %   Basophils Absolute 0.0 0.0 - 0.1 K/uL  Lipase, blood  Result Value Ref Range   Lipase 24 11 - 51 U/L  Urine microscopic-add on  Result Value Ref Range   Squamous Epithelial / LPF 6-30 (A) NONE SEEN   WBC, UA 6-30 0 - 5 WBC/hpf   RBC / HPF NONE SEEN 0 - 5 RBC/hpf   Bacteria, UA FEW (A) NONE SEEN  Protime-INR  Result Value Ref Range   Prothrombin Time 14.8 11.6 - 15.2 seconds   INR 1.14 0.00 - 1.49  CBC  Result Value Ref Range   WBC 21.2 (H) 4.0 - 10.5 K/uL   RBC 3.64 (L) 3.87 - 5.11 MIL/uL   Hemoglobin 11.2 (L) 12.0 - 15.0 g/dL   HCT 34.1 (L) 36.0 - 46.0 %   MCV 93.7 78.0 - 100.0 fL   MCH 30.8 26.0 - 34.0 pg   MCHC 32.8 30.0 - 36.0 g/dL   RDW 14.5 11.5 - 15.5 %   Platelets 344 150 - 400 K/uL  Comprehensive metabolic panel  Result Value Ref Range   Sodium 136 135 - 145 mmol/L   Potassium 4.7 3.5 - 5.1 mmol/L   Chloride 105 101 - 111 mmol/L   CO2 24 22 - 32 mmol/L   Glucose, Bld 105 (H) 65 - 99 mg/dL   BUN 12 6 - 20 mg/dL   Creatinine, Ser 0.96 0.44 - 1.00 mg/dL   Calcium 8.7 (L) 8.9 - 10.3 mg/dL   Total Protein 5.8 (L) 6.5 - 8.1 g/dL   Albumin 2.5 (L) 3.5 - 5.0 g/dL   AST 33 15 - 41 U/L   ALT 153 (H) 14 - 54 U/L   Alkaline Phosphatase 312 (H) 38 - 126 U/L  Total Bilirubin 1.1 0.3 - 1.2 mg/dL   GFR calc non Af Amer >60 >60 mL/min   GFR calc Af Amer >60 >60 mL/min   Anion gap 7 5 - 15  Amylase, Peritoneal Fluid  Result Value Ref Range   Amylase,  peritoneal fluid 70 U/L  Amylase  Result Value Ref Range   Amylase 35 28 - 100 U/L  CBC with Differential/Platelet  Result Value Ref Range   WBC 8.6 4.0 - 10.5 K/uL   RBC 3.79 (L) 3.87 - 5.11 MIL/uL   Hemoglobin 11.7 (L) 12.0 - 15.0 g/dL   HCT 34.7 (L) 36.0 - 46.0 %   MCV 91.6 78.0 - 100.0 fL   MCH 30.9 26.0 - 34.0 pg   MCHC 33.7 30.0 - 36.0 g/dL   RDW 13.8 11.5 - 15.5 %   Platelets 409 (H) 150 - 400 K/uL   Neutrophils Relative % 75 %   Neutro Abs 6.4 1.7 - 7.7 K/uL   Lymphocytes Relative 13 %   Lymphs Abs 1.1 0.7 - 4.0 K/uL   Monocytes Relative 7 %   Monocytes Absolute 0.6 0.1 - 1.0 K/uL   Eosinophils Relative 5 %   Eosinophils Absolute 0.5 0.0 - 0.7 K/uL   Basophils Relative 0 %   Basophils Absolute 0.0 0.0 - 0.1 K/uL  I-Stat CG4 Lactic Acid, ED  (not at [facility])  Result Value Ref Range   Lactic Acid, Venous 1.42 0.5 - 2.0 mmol/L  I-Stat CG4 Lactic Acid, ED  (not at [facility])  Result Value Ref Range   Lactic Acid, Venous 0.59 0.5 - 2.0 mmol/L    Dg Chest 2 View  03/22/2015  CLINICAL DATA:  59 year old female history of cough for the past 10 days following cholecystectomy. Elevated white blood cell count and new onset of fever. EXAM: CHEST  2 VIEW COMPARISON:  Chest x-ray 12/19/2014. FINDINGS: New elevation of the right hemidiaphragm. Linear opacity in the right lung base favored to reflect some passive subsegmental atelectasis. No acute consolidative airspace disease. No pleural effusions. No evidence of pulmonary edema. Heart size is normal. Upper mediastinal contours are within normal limits. IMPRESSION: 1. New elevation of the right hemidiaphragm compared to prior study from 12/19/2014. Given the patient's history of recent cholecystectomy, elevated white blood cell count and fever, the possibility of complications such as postcholecystectomy abscess or biloma should be considered, and further evaluation with contrast-enhanced CT of the abdomen and pelvis is suggested at this time.  These results were called by telephone at the time of interpretation on 03/22/2015 at 2:54 pm to Dr. Tyrone Nine, who verbally acknowledged these results. Electronically Signed   By: Vinnie Langton M.D.   On: 03/22/2015 14:56   Ct Chest W Contrast  03/22/2015  CLINICAL DATA:  Postoperative cholecystectomy 10 days ago. Increasing shortness of breath and cough since surgery. Low grade fever. EXAM: CT CHEST, ABDOMEN, AND PELVIS WITH CONTRAST TECHNIQUE: Multidetector CT imaging of the chest, abdomen and pelvis was performed following the standard protocol during bolus administration of intravenous contrast. CONTRAST:  3mL OMNIPAQUE IOHEXOL 300 MG/ML  SOLN COMPARISON:  Ultrasound abdomen 11/2014 FINDINGS: CT CHEST FINDINGS Mediastinum/Lymph Nodes: No masses, pathologically enlarged lymph nodes, or other significant abnormality. Lungs/Pleura: Small right pleural effusion with atelectasis in the right lung base. Nodular opacity in the superior segment right lower lobe adjacent to an area of atelectasis. The nodule measures about 9 mm diameter. Several additional scattered nodules are demonstrated bilaterally. Followup chest CT in 3 months after resolution  of acute process is recommended to evaluate for persistence. No pneumothorax. Slight scarring in the left lung base. Airways patent. Musculoskeletal: No chest wall mass or suspicious bone lesions identified. CT ABDOMEN PELVIS FINDINGS Hepatobiliary: Surgical absence of the gallbladder. No bile duct dilatation. Probable loculated subdiaphragmatic fluid collection surrounding the liver and measuring about 9 x 10.5 x 11.4 cm diameter. This may represent abscess or postoperative fluid collection. No gas is demonstrated within the collection. Small amount of fluid also demonstrated in the gallbladder fossa. No focal liver lesions. Portal veins appear patent. Pancreas: No mass, inflammatory changes, or other significant abnormality. Spleen: Within normal limits in size and  appearance. Adrenals/Urinary Tract: No masses identified. No evidence of hydronephrosis. Stomach/Bowel: Small esophageal hiatal hernia. Stomach and small bowel are decompressed. Contrast material flows through to the sigmoid colon suggesting no evidence of obstruction. Scattered stool in the colon. No bowel wall thickening or inflammatory changes suggested. Appendix is normal. Vascular/Lymphatic: No pathologically enlarged lymph nodes. No evidence of abdominal aortic aneurysm. Reproductive: Uterus and ovaries are not enlarged. No pelvic mass or lymphadenopathy. No free or loculated pelvic fluid collections. Other: No free air in the abdomen. Infiltration in the subcutaneous fat at the level of the umbilicus is likely postoperative. Musculoskeletal:  No suspicious bone lesions identified. IMPRESSION: Small right pleural effusion with atelectasis in the right lung base. Scattered bilateral pulmonary nodules, largest in the superior segment right lower lobe measures 9 mm. Recommend follow-up in 3 months after resolution of acute process. Large subdiaphragmatic fluid collections suggesting postoperative fluid collection versus abscess. No free air. No bile duct dilatation. Electronically Signed   By: Lucienne Capers M.D.   On: 03/22/2015 18:40   [place] Abscess Drain  03/23/2015  CLINICAL DATA:  59 year old female with a history of lap coli with postoperative fluid collection in the subdiaphragmatic region. She has been referred for drainage. EXAM: ULTRASOUND GUIDED ABSCESS DRAINAGE MEDICATIONS: 2.0 mg Versed, 0 mcg fentanyl COMPARISON:  CT 03/22/2015 PROCEDURE: The procedure, risks, benefits, and alternatives were explained to the patient. Questions regarding the procedure were encouraged and answered. The patient understands and consents to the procedure. Ultrasound survey of the right upper quadrant was performed. Images were stored and sent to PACs. The right upper quadrant anterior wall was prepped with Betadine in  a sterile fashion, and a sterile drape was applied covering the operative field. A sterile gown and sterile gloves were used for the procedure. Local anesthesia was provided with 1% Lidocaine. Once the patient is prepped and draped in the usual sterile fashion, the skin and subcutaneous tissues were generously infiltrated with 1% lidocaine for local anesthesia. A small stab incision was made with 11 blade scalpel. Trocar technique was used to advance a 10 [place] drain into the subdiaphragmatic collection. Bilious drainage was returned. Approximately 600 cc of dark fluid aspirated. Sample was sent to the lab. Pigtail catheter was formed and a final image was stored. Patient tolerated the procedure well and remained hemodynamically stable throughout. No complications encountered and no significant blood loss encountered. Drain was sutured into position and left to gravity drainage. FINDINGS: Ultrasound survey demonstrates subdiaphragmatic fluid collection. Approximately 600 cc of fluid aspirated. IMPRESSION: Status post ultrasound-guided drainage of sub diaphragmatic fluid collection. Sample sent to the lab. Signed, Dulcy Fanny. Earleen Newport, DO [facility] Electronically Signed   By: Corrie Mckusick D.O.   On: 03/23/2015 18:04   Ct Abdomen Pelvis W Contrast  03/22/2015  CLINICAL DATA:  Postoperative cholecystectomy  10 days ago. Increasing shortness of breath and cough since surgery. Low grade fever. EXAM: CT CHEST, ABDOMEN, AND PELVIS WITH CONTRAST TECHNIQUE: Multidetector CT imaging of the chest, abdomen and pelvis was performed following the standard protocol during bolus administration of intravenous contrast. CONTRAST:  66mL OMNIPAQUE IOHEXOL 300 MG/ML  SOLN COMPARISON:  Ultrasound abdomen 11/2014 FINDINGS: CT CHEST FINDINGS Mediastinum/Lymph Nodes: No masses, pathologically enlarged lymph nodes, or other significant abnormality. Lungs/Pleura: Small right pleural  effusion with atelectasis in the right lung base. Nodular opacity in the superior segment right lower lobe adjacent to an area of atelectasis. The nodule measures about 9 mm diameter. Several additional scattered nodules are demonstrated bilaterally. Followup chest CT in 3 months after resolution of acute process is recommended to evaluate for persistence. No pneumothorax. Slight scarring in the left lung base. Airways patent. Musculoskeletal: No chest wall mass or suspicious bone lesions identified. CT ABDOMEN PELVIS FINDINGS Hepatobiliary: Surgical absence of the gallbladder. No bile duct dilatation. Probable loculated subdiaphragmatic fluid collection surrounding the liver and measuring about 9 x 10.5 x 11.4 cm diameter. This may represent abscess or postoperative fluid collection. No gas is demonstrated within the collection. Small amount of fluid also demonstrated in the gallbladder fossa. No focal liver lesions. Portal veins appear patent. Pancreas: No mass, inflammatory changes, or other significant abnormality. Spleen: Within normal limits in size and appearance. Adrenals/Urinary Tract: No masses identified. No evidence of hydronephrosis. Stomach/Bowel: Small esophageal hiatal hernia. Stomach and small bowel are decompressed. Contrast material flows through to the sigmoid colon suggesting no evidence of obstruction. Scattered stool in the colon. No bowel wall thickening or inflammatory changes suggested. Appendix is normal. Vascular/Lymphatic: No pathologically enlarged lymph nodes. No evidence of abdominal aortic aneurysm. Reproductive: Uterus and ovaries are not enlarged. No pelvic mass or lymphadenopathy. No free or loculated pelvic fluid collections. Other: No free air in the abdomen. Infiltration in the subcutaneous fat at the level of the umbilicus is likely postoperative. Musculoskeletal:  No suspicious bone lesions identified. IMPRESSION: Small right pleural effusion with atelectasis in the right  lung base. Scattered bilateral pulmonary nodules, largest in the superior segment right lower lobe measures 9 mm. Recommend follow-up in 3 months after resolution of acute process. Large subdiaphragmatic fluid collections suggesting postoperative fluid collection versus abscess. No free air. No bile duct dilatation. Electronically Signed   By: Lucienne Capers M.D.   On: 03/22/2015 18:40    Discharge Exam:  Filed Vitals:   03/25/15 2225 03/26/15 0512  BP: 147/89 131/76  Pulse: 104 86  Temp: 98.9 F (37.2 C) 98.5 F (36.9 C)  Resp: 18 16    General: WN WF who is alert and generally healthy appearing.  Lungs: Clear to auscultation and symmetric breath sounds. Heart:  RRR. No murmur or rub.  Slightly decreased breath sounds in her right base. Abdomen: Soft. No mass. No hernia. Normal bowel sounds.  Drain in RUQ.  Discharge Medications:     Medication List    TAKE these medications        acetaminophen 325 MG tablet  Commonly known as:  TYLENOL  Take 650 mg by mouth every 6 (six) hours as needed for moderate pain.     amoxicillin-clavulanate 875-125 MG tablet  Commonly known as:  AUGMENTIN  Take 1 tablet by mouth 2 (two) times daily.     DELSYM 30 MG/5ML liquid  Generic drug:  dextromethorphan  Take 60 mg by mouth as needed for cough.     EPIPEN 2-PAK  0.3 mg/0.3 mL Soaj injection  Generic drug:  EPINEPHrine  Inject 0.3 mg into the muscle once.     estradiol 0.1 MG/GM vaginal cream  Commonly known as:  ESTRACE VAGINAL  Use 1/2 gram per vagina twice a week as needed.     losartan 100 MG tablet  Commonly known as:  COZAAR  Take 100 mg by mouth daily.     PEDIALYTE Soln  Take 240 mLs by mouth every 8 (eight) hours as needed (for minerals).     PROAIR HFA 108 (90 BASE) MCG/ACT inhaler  Generic drug:  albuterol  Inhale 2 puffs into the lungs daily as needed.     PROBIOTIC DAILY Caps  Take 1 capsule by mouth daily.     saccharomyces boulardii 250 MG capsule  Commonly  known as:  FLORASTOR  Take 1 capsule (250 mg total) by mouth 2 (two) times daily.        Disposition: 01-Home or Self Care      Discharge Instructions    Diet - low sodium heart healthy    Complete by:  As directed      Increase activity slowly    Complete by:  As directed             Activity:  Driving - May drive in 2 or 3 days, if doing well   Lifting - No lifting more than 15 pounds for 1 week, then no limit  Wound Care:   Empty drain at least once a day and record amount      Bring that record with you to our office  Diet:  As tolerated.  Drink a lot of water.  Follow up appointment:  Call Dr. Clyda Greener office [facility]) at [phone] for an appointment in 1 week.  Medications and dosages:  Resume your home medications.  You have a prescription for:  Florastor and Augmentin    Signed: Alphonsa Overall, M.D., [facility] Office:  [phone]  03/26/2015, 8:00 AM

## 2015-03-26 NOTE — Discharge Instructions (Signed)
[facility] - DISCHARGE INSTRUCTIONS TO PATIENT  Activity:  Driving - May drive in 2 or 3 days, if doing well   Lifting - No lifting more than 15 pounds for 1 week, then no limit  Wound Care:   Empty drain at least once a day and record amount      Bring that record with you to our office  Diet:  As tolerated.  Drink a lot of water.  Follow up appointment:  Call Dr. Clyda Greener office [facility]) at [phone] for an appointment in 1 week.  Medications and dosages:  Resume your home medications.  You have a prescription for:  Florastor and Augmentin   Call Dr. Lucia Gaskins or his office  [phone]) if you have:  Temperature greater than 100.4,  Persistent nausea and vomiting,  Severe uncontrolled pain,  Redness, tenderness, or signs of infection (pain, swelling, redness, odor or green/yellow discharge around the site),  Difficulty breathing, headache or visual disturbances,  Any other questions or concerns you may have after discharge.  In an emergency, call 911 or go to an Emergency Department at a nearby hospital.

## 2015-03-27 LAB — CULTURE, BLOOD (ROUTINE X 2)
Culture: NO GROWTH
Culture: NO GROWTH

## 2015-03-27 LAB — CULTURE, ROUTINE-ABSCESS: Culture: NO GROWTH

## 2015-03-27 LAB — TOTAL BILIRUBIN, BODY FLUID: Total bilirubin, fluid: 71.5 mg/dL

## 2015-03-29 ENCOUNTER — Other Ambulatory Visit: Payer: Self-pay | Admitting: Surgery

## 2015-03-29 DIAGNOSIS — R188 Other ascites: Secondary | ICD-10-CM

## 2015-04-06 ENCOUNTER — Ambulatory Visit
Admission: RE | Admit: 2015-04-06 | Discharge: 2015-04-06 | Disposition: A | Discharge status: Home / Self Care | Payer: BC Managed Care – PPO | Source: Ambulatory Visit | Attending: Surgery | Admitting: Surgery

## 2015-04-06 ENCOUNTER — Ambulatory Visit
Admission: RE | Admit: 2015-04-06 | Discharge: 2015-04-06 | Disposition: A | Discharge status: Home / Self Care | Payer: BC Managed Care – PPO | Source: Ambulatory Visit | Attending: Radiology | Admitting: Radiology

## 2015-04-06 DIAGNOSIS — R188 Other ascites: Secondary | ICD-10-CM

## 2015-04-06 MED ORDER — IOPAMIDOL (ISOVUE-300) INJECTION 61%
100.0000 mL | Freq: Once | INTRAVENOUS | Status: AC | PRN
  Administered 2015-04-06: 100 mL via INTRAVENOUS

## 2015-04-06 NOTE — Progress Notes (Signed)
Referring Physician(s): Dr Michael Boston  Chief Complaint: The patient is seen in follow up today s/p Status post ultrasound-guided drainage of sub diaphragmatic fluid Collection 03/23/2015  History of present illness:  Pt with Hx lap cholecystectomy 03/12/2015. Developed subdiaphragmatic collection and drain placed 11/22. Returns today for evaluation CT and drain injection. States she feels so much better now. She is not flushing drain. (no instruction to do so) Output varies from 100 cc daily to minimal; none so far today but 50 cc yesterday Denies fever/ N/V Denies pain. Pain in Rt shoulder has resolved.  Past Medical History  Diagnosis Date  . Depression     Tx'd in 08/11/1989 after a death of an infant  . Dyspareunia   . Hypertension   . Chronic cholecystitis with calculus s/p lap chole 03/12/2015 03/22/2015    Past Surgical History  Procedure Laterality Date  . Breast surgery  1994    benign cyst removed left breast  . Cesarean section  1991  . Laparoscopic cholecystectomy single site with intraoperative cholangiogram  03/12/2015    Dr Johney Maine  . Cholecystectomy      Allergies: Nsaids and Bee venom  Medications: Prior to Admission medications   Medication Sig Start Date End Date Taking? Authorizing Provider  acetaminophen (TYLENOL) 325 MG tablet Take 650 mg by mouth every 6 (six) hours as needed for moderate pain.     Historical Provider, MD  amoxicillin-clavulanate (AUGMENTIN) 875-125 MG tablet Take 1 tablet by mouth 2 (two) times daily. 03/26/15   Alphonsa Overall, MD  dextromethorphan (DELSYM) 30 MG/5ML liquid Take 60 mg by mouth as needed for cough.    Historical Provider, MD  EPIPEN 2-PAK 0.3 MG/0.3ML SOAJ injection Inject 0.3 mg into the muscle once. 12/30/14   Historical Provider, MD  estradiol (ESTRACE VAGINAL) 0.1 MG/GM vaginal cream Use 1/2 gram per vagina twice a week as needed. Patient not taking: Reported on 12/19/2014 10/09/13   Nunzio Cobbs, MD    losartan (COZAAR) 100 MG tablet Take 100 mg by mouth daily.    Historical Provider, MD  PEDIALYTE (PEDIALYTE) SOLN Take 240 mLs by mouth every 8 (eight) hours as needed (for minerals).    Historical Provider, MD  PROAIR HFA 108 (90 BASE) MCG/ACT inhaler Inhale 2 puffs into the lungs daily as needed. 01/20/15   Historical Provider, MD  Probiotic Product (PROBIOTIC DAILY) CAPS Take 1 capsule by mouth daily.    Historical Provider, MD  saccharomyces boulardii (FLORASTOR) 250 MG capsule Take 1 capsule (250 mg total) by mouth 2 (two) times daily. 03/26/15   Alphonsa Overall, MD     Family History  Problem Relation Age of Onset  . Thyroid disease Mother     hypothyroid  . Hypertension Father   . Thyroid disease Sister     Hashimotos    Social History   Social History  . Marital Status: Single    Spouse Name: N/A  . Number of Children: N/A  . Years of Education: N/A   Social History Main Topics  . Smoking status: Former Smoker    Quit date: 05/01/1984  . Smokeless tobacco: Not on file  . Alcohol Use: No     Comment: once weekly  . Drug Use: No  . Sexual Activity:    Partners: Male    Birth Control/ Protection: Post-menopausal   Other Topics Concern  . Not on file   Social History Narrative     Vital Signs: LMP 05/01/2004  T:  98.2; BP 181/86; O2sat 99% RA; P 103  Physical Exam  Constitutional: She appears well-nourished.  Abdominal: Soft. Bowel sounds are normal. She exhibits no distension. There is no tenderness.  Skin site of RUQ drain is NT;no bleeding; no sign of infection Output brownish fluid   Neurological: She is alert.  Skin: Skin is warm and dry.  Psychiatric: She has a normal mood and affect.  Nursing note and vitals reviewed.   Imaging: No results found.  Labs:  CBC:  Recent Labs  12/19/14 1600 03/22/15 1412 03/24/15 0531 03/26/15 0450  WBC 7.6 18.1* 21.2* 8.6  HGB 14.4 13.8 11.2* 11.7*  HCT 43.4 40.9 34.1* 34.7*  PLT 185 372 344 409*     COAGS:  Recent Labs  03/23/15 1350  INR 1.14    BMP:  Recent Labs  12/19/14 1600 03/22/15 1412 03/24/15 0531  NA 141 137 136  K 4.0 4.6 4.7  CL 109 102 105  CO2 22 25 24   GLUCOSE 129* 122* 105*  BUN 16 12 12   CALCIUM 9.6 9.5 8.7*  CREATININE 1.08* 1.31* 0.96  GFRNONAA 55* 43* >60  GFRAA >60 50* >60    LIVER FUNCTION TESTS:  Recent Labs  12/19/14 1600 03/22/15 1412 03/24/15 0531  BILITOT 0.7 1.7* 1.1  AST 24 174* 33  ALT 24 365* 153*  ALKPHOS 95 495* 312*  PROT 6.9 7.8 5.8*  ALBUMIN 4.2 3.1* 2.5*    Assessment:  Lap Cholecystectomy 03/12/2015 Subdiaphragmatic collection drain placed 03/23/15 Still draining anywhere from 100 cc/daily to minimal CT today reveals only small collcetion remains Drain injection reveals filling of that small collection Dr Earleen Newport discussed with pt and husband Will leave drain in for now Pt to see Dr Johney Maine in 1 week Plan per Dr Johney Maine  Signed: Monia Sabal A 04/06/2015, 1:18 PM   Please refer to Dr. Earleen Newport attestation of this note for management and plan.

## 2015-04-08 ENCOUNTER — Telehealth: Payer: Self-pay

## 2015-04-08 NOTE — Telephone Encounter (Signed)
Patient called to report running a fever after contrast was injected into her drainage catheter Tuesday.  She wants to know if the contrast could be causing her fever.  I told her that the contrast wouldn't be the cause of a fever but that the fever could be a sign of something going on with the biloma formation versus a viral infection otherwise unrelated to her drain.  She has already called Dr. Johney Maine, who is out of town for two weeks, but is awaiting a return call from the triage nurse with whom she spoke.  Brita Romp, RN

## 2015-04-09 ENCOUNTER — Other Ambulatory Visit: Payer: Self-pay | Admitting: Gastroenterology

## 2015-04-09 ENCOUNTER — Encounter (HOSPITAL_COMMUNITY): Payer: Self-pay | Admitting: *Deleted

## 2015-04-09 NOTE — Addendum Note (Signed)
Addended by: Savina Olshefski on: 04/09/2015 12:43 PM   Modules accepted: Orders  

## 2015-04-15 ENCOUNTER — Other Ambulatory Visit (HOSPITAL_COMMUNITY): Payer: Self-pay | Admitting: Surgery

## 2015-04-15 DIAGNOSIS — K9189 Other postprocedural complications and disorders of digestive system: Principal | ICD-10-CM

## 2015-04-15 DIAGNOSIS — K838 Other specified diseases of biliary tract: Secondary | ICD-10-CM

## 2015-04-21 ENCOUNTER — Ambulatory Visit (HOSPITAL_COMMUNITY)
Admission: RE | Admit: 2015-04-21 | Discharge: 2015-04-21 | Disposition: A | Discharge status: Home / Self Care | Payer: BC Managed Care – PPO | Source: Ambulatory Visit | Attending: Surgery | Admitting: Surgery

## 2015-04-21 ENCOUNTER — Ambulatory Visit (HOSPITAL_COMMUNITY)
Admission: RE | Admit: 2015-04-21 | Payer: BC Managed Care – PPO | Source: Ambulatory Visit | Admitting: Gastroenterology

## 2015-04-21 ENCOUNTER — Other Ambulatory Visit (HOSPITAL_COMMUNITY): Payer: Self-pay | Admitting: Surgery

## 2015-04-21 DIAGNOSIS — Z9049 Acquired absence of other specified parts of digestive tract: Secondary | ICD-10-CM | POA: Insufficient documentation

## 2015-04-21 DIAGNOSIS — K9189 Other postprocedural complications and disorders of digestive system: Secondary | ICD-10-CM | POA: Diagnosis present

## 2015-04-21 DIAGNOSIS — K838 Other specified diseases of biliary tract: Secondary | ICD-10-CM

## 2015-04-21 HISTORY — DX: Personal history of other diseases of the respiratory system: Z87.09

## 2015-04-21 HISTORY — DX: Other specified diseases of biliary tract: K83.8

## 2015-04-21 HISTORY — DX: Personal history of other specified conditions: Z87.898

## 2015-04-21 SURGERY — ERCP, WITH INTERVENTION IF INDICATED
Anesthesia: General

## 2015-04-21 MED ORDER — TECHNETIUM TC 99M MEBROFENIN IV KIT
5.3000 | PACK | Freq: Once | INTRAVENOUS | Status: AC | PRN
  Administered 2015-04-21: 5 via INTRAVENOUS

## 2015-04-22 NOTE — Progress Notes (Signed)
Quick Note:  Pt s/p lap chole 03/12/2015 with perc drain of fluid collection 03/23/2015 Study reveals no bile leak  IMPRESSION: Prior cholecystectomy. No evidence of postoperative bile leak or biliary obstruction.  Alisha CCS MA, please call and tell the patient the good news.  If the drain output is <69mL a day & she is feeling well, set up time for drain to be removed in the office today/Friday Angela Nevin Xmas to her) - nurse only visit is fine vs seeing if a partner can do it. I have no time before the holidays, unfortunately  Thanks,  Adin Hector, M.D., F.A.C.S. Gastrointestinal and Minimally Invasive Surgery [facility] [address] [place],  [address] [phone] Main / Paging   ______

## 2015-07-14 ENCOUNTER — Other Ambulatory Visit: Payer: Self-pay | Admitting: Family Medicine

## 2015-07-14 DIAGNOSIS — R918 Other nonspecific abnormal finding of lung field: Secondary | ICD-10-CM

## 2015-07-22 ENCOUNTER — Other Ambulatory Visit: Payer: BC Managed Care – PPO

## 2015-08-03 ENCOUNTER — Ambulatory Visit
Admission: RE | Admit: 2015-08-03 | Discharge: 2015-08-03 | Disposition: A | Discharge status: Home / Self Care | Payer: BC Managed Care – PPO | Source: Ambulatory Visit | Attending: Family Medicine | Admitting: Family Medicine

## 2015-08-03 DIAGNOSIS — R918 Other nonspecific abnormal finding of lung field: Secondary | ICD-10-CM

## 2015-08-03 MED ORDER — IOPAMIDOL (ISOVUE-300) INJECTION 61%
75.0000 mL | Freq: Once | INTRAVENOUS | Status: AC | PRN
  Administered 2015-08-03: 75 mL via INTRAVENOUS

## 2015-09-14 ENCOUNTER — Other Ambulatory Visit: Payer: Self-pay | Admitting: Gastroenterology

## 2015-11-05 ENCOUNTER — Ambulatory Visit: Payer: BC Managed Care – PPO | Admitting: Obstetrics and Gynecology

## 2016-01-24 ENCOUNTER — Encounter: Payer: Self-pay | Admitting: Nurse Practitioner

## 2016-01-24 ENCOUNTER — Ambulatory Visit (INDEPENDENT_AMBULATORY_CARE_PROVIDER_SITE_OTHER): Payer: BC Managed Care – PPO | Admitting: Nurse Practitioner

## 2016-01-24 VITALS — BP 120/84 | HR 68 | Temp 97.9°F | Ht 67.5 in | Wt 188.0 lb

## 2016-01-24 DIAGNOSIS — R300 Dysuria: Secondary | ICD-10-CM | POA: Diagnosis not present

## 2016-01-24 DIAGNOSIS — N760 Acute vaginitis: Secondary | ICD-10-CM | POA: Diagnosis not present

## 2016-01-24 LAB — POCT URINALYSIS DIPSTICK
Bilirubin, UA: NEGATIVE
Glucose, UA: NEGATIVE
Ketones, UA: NEGATIVE
Nitrite, UA: NEGATIVE
Protein, UA: NEGATIVE
Urobilinogen, UA: NEGATIVE
pH, UA: 6

## 2016-01-24 MED ORDER — METRONIDAZOLE 0.75 % VA GEL: 1.0000 | Freq: Every day | VAGINAL | 0 refills | Status: DC

## 2016-01-24 MED ORDER — FLUCONAZOLE 150 MG PO TABS: 150.0000 mg | ORAL_TABLET | Freq: Once | ORAL | 0 refills | Status: AC

## 2016-01-24 NOTE — Patient Instructions (Signed)

## 2016-01-24 NOTE — Progress Notes (Signed)
60 y.o.  Married female [id number] here with complaint of vaginal symptoms of itching, burning.  Patient was on Keflex last week for infected sebaceous cyst that has now improved.   Onset of symptoms 2-3  days ago.  She and her daughter went on vacation and traveled a lot this past week.  She did eat foods that was high is sugar, spices, and more ETOH than usual.  Denies new personal products but does have vaginal dryness. Urinary symptoms with dysuria.  She is having vulvar burning.  No kidney symptoms, flank pain, fever or chills.   O:  Healthy female WDWN Affect: normal, orientation x 3  Exam: no distress Abdomen: soft and non tender, no flank pain Lymph node: no enlargement or tenderness Pelvic exam: External genital: normal female but with extensive areas of redness - almost desquamative look to the vulva BUS: negative Vagina: very little thin discharge noted.  Affirm taken.   A: Vaginitis  R/O UTI   P: Discussed findings of vaginitis and etiology. Discussed Aveeno or baking soda sitz bath for comfort. Avoid moist clothes or pads for extended period of time. If working out in gym clothes or swim suits for long periods of time change underwear or bottoms of swimsuit if possible. Olive Oil/Coconut Oil use for skin protection prior to activity can be used to external skin.  Rx: will go ahead and start her on Metrogel vaginal cream and Diflucan pending the results because of the severity of symptoms.  Will recheck in a week. If not better by Friday to call for apt.  Follow with Affirm  RV prn

## 2016-01-25 LAB — WET PREP BY MOLECULAR PROBE
Candida species: NEGATIVE
Gardnerella vaginalis: POSITIVE — AB
Trichomonas vaginosis: NEGATIVE

## 2016-01-25 LAB — URINE CULTURE: Organism ID, Bacteria: NO GROWTH

## 2016-01-25 NOTE — Progress Notes (Signed)
Encounter reviewed by Dr. Brook Amundson C. Silva.  

## 2016-01-31 ENCOUNTER — Encounter: Payer: Self-pay | Admitting: Obstetrics and Gynecology

## 2016-01-31 ENCOUNTER — Ambulatory Visit: Payer: BC Managed Care – PPO | Admitting: Nurse Practitioner

## 2016-01-31 ENCOUNTER — Ambulatory Visit (INDEPENDENT_AMBULATORY_CARE_PROVIDER_SITE_OTHER): Payer: BC Managed Care – PPO | Admitting: Obstetrics and Gynecology

## 2016-01-31 VITALS — BP 130/80 | HR 76 | Resp 14 | Wt 190.0 lb

## 2016-01-31 DIAGNOSIS — N952 Postmenopausal atrophic vaginitis: Secondary | ICD-10-CM

## 2016-01-31 DIAGNOSIS — N9089 Other specified noninflammatory disorders of vulva and perineum: Secondary | ICD-10-CM

## 2016-01-31 MED ORDER — ESTRADIOL 0.1 MG/GM VA CREA: TOPICAL_CREAM | VAGINAL | 1 refills | Status: DC

## 2016-01-31 NOTE — Progress Notes (Signed)
GYNECOLOGY  VISIT   HPI: 60 y.o.   Married  Caucasian  female   [id number] with Patient's last menstrual period was 05/01/2004.   here for F/U vulvovaginitis. The patient was seen last week by Edman Circle for severe vaginitis. Treated for BV and prophylacticly for yeast.    Ever since menopause, she has had issues from vaginal-vulvar atrophy.  She started the recent vaginitis symptoms after being treated with keflex. She felt more irritated after taking the metrogel, starting to calm down. She has some external burning with irritation. The erythema hasn't improved much. She developed bad itching on the metrogel and that's when she started the diflucan. She used to use the vaginal estrace cream. She stopped using it about a year ago, she was worried about risks.   GYNECOLOGIC HISTORY: Patient's last menstrual period was 05/01/2004. Contraception:postmenopause Menopausal hormone therapy: none         OB History    Gravida Para Term Preterm AB Living   3 3 3  0 0 2   SAB TAB Ectopic Multiple Live Births   0 0 0 0 3         Patient Active Problem List   Diagnosis Date Noted  . Intra-abdominal fluid collection s/p perc drainage 03/23/2015 03/23/2015  . Chronic cholecystitis with calculus s/p lap chole 03/12/2015 03/22/2015  . Hypertension     Past Medical History:  Diagnosis Date  . Bile duct leak    bile drainage tube in place.  . Chronic cholecystitis with calculus s/p lap chole 03/12/2015 03/22/2015  . Depression    Tx'd in 1989-08-15 after a death of an infant  . Dyspareunia   . History of chronic cough    since gallbladder surgery-"fluid built up under right lung", "is improved"  . Hypertension     Past Surgical History:  Procedure Laterality Date  . BREAST SURGERY  1994   benign cyst removed left breast  . CESAREAN SECTION  1991  . CHOLECYSTECTOMY     8'16 laparoscopic- bile leak in abdomen, ERD visit -drain placed in to drain about 1 liter bile fluid  . LAPAROSCOPIC  CHOLECYSTECTOMY SINGLE SITE WITH INTRAOPERATIVE CHOLANGIOGRAM  03/12/2015   Dr Johney Maine    Current Outpatient Prescriptions  Medication Sig Dispense Refill  . acetaminophen (TYLENOL) 325 MG tablet Take 650 mg by mouth every 6 (six) hours as needed for moderate pain.     Marland Kitchen EPIPEN 2-PAK 0.3 MG/0.3ML SOAJ injection Inject 0.3 mg into the muscle once as needed (allergic reaction).     Marland Kitchen losartan (COZAAR) 100 MG tablet Take 100 mg by mouth daily.    Marland Kitchen PROAIR HFA 108 (90 BASE) MCG/ACT inhaler Inhale 2 puffs into the lungs daily as needed (before exercising).      No current facility-administered medications for this visit.      ALLERGIES: Nsaids and Bee venom  Family History  Problem Relation Age of Onset  . Thyroid disease Mother     hypothyroid  . Hypertension Father   . Thyroid disease Sister     Hashimotos    Social History   Social History  . Marital status: Married    Spouse name: N/A  . Number of children: N/A  . Years of education: N/A   Occupational History  . Not on file.   Social History Main Topics  . Smoking status: Former Smoker    Quit date: 05/01/1984  . Smokeless tobacco: Never Used  . Alcohol use No  Comment: once weekly to rare  . Drug use: No  . Sexual activity: Yes    Partners: Male    Birth control/ protection: Post-menopausal   Other Topics Concern  . Not on file   Social History Narrative  . No narrative on file    Review of Systems  Constitutional: Negative.   HENT: Negative.   Eyes: Negative.   Respiratory: Negative.   Cardiovascular: Negative.   Gastrointestinal: Negative.   Genitourinary:       Vaginal irritation/ redness    Musculoskeletal: Negative.   Skin: Negative.   Neurological: Negative.   Endo/Heme/Allergies: Negative.   Psychiatric/Behavioral: Negative.     PHYSICAL EXAMINATION:    BP 130/80 (BP Location: Right Arm, Patient Position: Sitting, Cuff Size: Normal)   Pulse 76   Resp 14   Wt 190 lb (86.2 kg)   LMP  05/01/2004   BMI 29.32 kg/m     General appearance: alert, cooperative and appears stated age  Pelvic: External genitalia:  no lesions, marked erythema just at the opening of the vagina, not on the labia.               Urethra:  normal appearing urethra with no masses, tenderness or lesions              Bartholins and Skenes: normal                 Vagina: atrophic, no marked erythema in the vagina, no discharge              Cervix: no lesions               Chaperone was present for exam.  Wet prep: no clue, no trich, rare wbc, +parabasilar cells, some artifact  KOH: no yeast PH: 5.5  ASSESSMENT Vulvovaginitis, improved s/p metrogel and diflucan, still with irritation and burning, negative vaginal slides.  Atrophic vaginitis    PLAN Start vaginal estrace cream F/U for an annual exam in one month and for f/u on the estrace cream Discussed the very small amount of absorption of vaginal estrogen and the very small risks Discussed using Vaseline externally   An After Visit Summary was printed and given to the patient.  >15 minutes face to face time of which over 50% was spent in counseling.

## 2016-03-01 ENCOUNTER — Encounter: Payer: Self-pay | Admitting: Obstetrics and Gynecology

## 2016-03-01 NOTE — Progress Notes (Signed)
Patient ID: Shelby Becker, female   DOB: 05/30/1955, 60 y.o.   MRN: [id number]  59 y.o. [id number] MarriedCaucasianF here for annual exam.  The patient restarted estrogen vaginal cream last month.  The cream was initially irritating, feeling better now. She doesn't like the compound of the estrogen. Sexually active, infrequent, uses a lubricant. No vaginal bleeding.  She denies symptoms of prolapse, used to have a mild bulge. No bowel or urinary c/o. She does c/o a tight band of tissue just below her vagina, bothers her sometimes when she stands.     Patient's last menstrual period was 05/01/2004.          Sexually active: Yes.    The current method of family planning is post menopausal status.    Exercising: Yes.    walking when able Smoker:  no  Health Maintenance: Pap: 10/09/13, negative with neg HR HPV History of abnormal Pap: yes, Hx colposcopy and cryotherapy to cervix 1986 MMG: 11/17/15, 3D, Bi-Rads 1:  Negative; [facility] Colonoscopy: 09/14/15, Tubular Adenoma, repeat in 5122  years BMD: 11/17/15, 0.10 Spine / -1.20 Right Femur Neck / -1.50 Left Femur Neck TDaP:  UTD Gardasil: N/A   reports that she quit smoking about 31 years ago. She has never used smokeless tobacco. She reports that she does not drink alcohol or use drugs. Retired. Daughter is in [place] works at [employer], [employer]. Son is 49, local. No grandchildren.   Past Medical History:  Diagnosis Date  . Bile duct leak    bile drainage tube in place.  . Chronic cholecystitis with calculus s/p lap chole 03/12/2015 03/22/2015  . Depression    Tx'd in Aug 09, 1989 after a death of an infant  . Dyspareunia   . History of chronic cough    since gallbladder surgery-"fluid built up under right lung", "is improved"  . Hypertension     Past Surgical History:  Procedure Laterality Date  . BREAST SURGERY  1994   benign cyst removed left breast  . CESAREAN SECTION  1991  . CHOLECYSTECTOMY     8'16 laparoscopic- bile leak in abdomen,  ERD visit -drain placed in to drain about 1 liter bile fluid  . LAPAROSCOPIC CHOLECYSTECTOMY SINGLE SITE WITH INTRAOPERATIVE CHOLANGIOGRAM  03/12/2015   Dr Johney Maine    Current Outpatient Prescriptions  Medication Sig Dispense Refill  . acetaminophen (TYLENOL) 325 MG tablet Take 650 mg by mouth every 6 (six) hours as needed for moderate pain.     Marland Kitchen EPIPEN 2-PAK 0.3 MG/0.3ML SOAJ injection Inject 0.3 mg into the muscle once as needed (allergic reaction).     Marland Kitchen estradiol (ESTRACE) 0.1 MG/GM vaginal cream 1 gram vaginally twice weekly 42.5 g 1  . losartan (COZAAR) 100 MG tablet Take 100 mg by mouth daily.    Marland Kitchen PROAIR HFA 108 (90 BASE) MCG/ACT inhaler Inhale 2 puffs into the lungs daily as needed (before exercising).      No current facility-administered medications for this visit.     Family History  Problem Relation Age of Onset  . Thyroid disease Mother     hypothyroid  . Hypertension Father   . Thyroid disease Sister     Hashimotos    Review of Systems  Genitourinary: Positive for frequency.  Musculoskeletal: Positive for arthralgias.  All other systems reviewed and are negative.   Exam:   BP 122/84 (BP Location: Right Arm, Patient Position: Sitting, Cuff Size: Large)   Pulse 68   Ht 5\' 7"  (1.702 m)  Wt 191 lb (86.6 kg)   LMP 05/01/2004   BMI 29.91 kg/m   Weight change: @WEIGHTCHANGE @ Height:   Height: 5\' 7"  (170.2 cm)  Ht Readings from Last 3 Encounters:  03/02/16 5\' 7"  (1.702 m)  01/24/16 5' 7.5" (1.715 m)  03/22/15 5' 7.5" (1.715 m)    General appearance: alert, cooperative and appears stated age Head: Normocephalic, without obvious abnormality, atraumatic Neck: no adenopathy, supple, symmetrical, trachea midline and thyroid normal to inspection and palpation Lungs: clear to auscultation bilaterally Breasts: normal appearance, no masses or tenderness Heart: regular rate and rhythm Abdomen: soft, non-tender; bowel sounds normal; no masses,  no  organomegaly Extremities: extremities normal, atraumatic, no cyanosis or edema Skin: Skin color, texture, turgor normal. No rashes or lesions Lymph nodes: Cervical, supraclavicular, and axillary nodes normal. No abnormal inguinal nodes palpated Neurologic: Grossly normal   Pelvic: External genitalia:  no lesions, some erythema on the inner labia minora on the right and posterior fourchette. Just below the posterior fourchette is a tight band of tissue              Urethra:  normal appearing urethra with no masses, tenderness or lesions              Bartholins and Skenes: normal                 Vagina: normal appearing atrophic vagina with normal color and discharge, no lesions. Grade 1 cystocele and grade 1 rectocele (asymptomatic)              Cervix: no lesions               Bimanual Exam:  Uterus:  normal size, contour, position, consistency, mobility, non-tender              Adnexa: no mass, fullness, tenderness               Rectovaginal: Confirms               Anus:  normal sphincter tone, no lesions  Chaperone was present for exam.  A:  Well Woman with normal exam  Atrophic vaginitis, improved with estrogen  Tight band of tissue outside of her vagina  P:   Pap next year  Discussed breast self exam  Discussed calcium and vit D intake  Labs with primary MD  Mammogram, colonoscopy and DEXA are UTD  Change to generic vagifem  She will use the estrace cream on the band of tissue outside of her vagina, discussed the option of releasing the tissue

## 2016-03-02 ENCOUNTER — Ambulatory Visit (INDEPENDENT_AMBULATORY_CARE_PROVIDER_SITE_OTHER): Payer: BC Managed Care – PPO | Admitting: Obstetrics and Gynecology

## 2016-03-02 ENCOUNTER — Encounter: Payer: Self-pay | Admitting: Obstetrics and Gynecology

## 2016-03-02 VITALS — BP 122/84 | HR 68 | Ht 67.0 in | Wt 191.0 lb

## 2016-03-02 DIAGNOSIS — Z01419 Encounter for gynecological examination (general) (routine) without abnormal findings: Secondary | ICD-10-CM

## 2016-03-02 DIAGNOSIS — N952 Postmenopausal atrophic vaginitis: Secondary | ICD-10-CM | POA: Diagnosis not present

## 2016-03-02 MED ORDER — ESTRADIOL 10 MCG VA TABS: 1.0000 | ORAL_TABLET | VAGINAL | 3 refills | Status: DC

## 2016-03-02 NOTE — Patient Instructions (Signed)

## 2016-10-13 ENCOUNTER — Other Ambulatory Visit: Payer: Self-pay | Admitting: Family Medicine

## 2016-10-13 DIAGNOSIS — R911 Solitary pulmonary nodule: Secondary | ICD-10-CM

## 2016-10-18 ENCOUNTER — Other Ambulatory Visit: Payer: Self-pay | Admitting: Family Medicine

## 2016-10-18 ENCOUNTER — Ambulatory Visit
Admission: RE | Admit: 2016-10-18 | Discharge: 2016-10-18 | Disposition: A | Discharge status: Home / Self Care | Payer: BC Managed Care – PPO | Source: Ambulatory Visit | Attending: Family Medicine | Admitting: Family Medicine

## 2016-10-18 DIAGNOSIS — R911 Solitary pulmonary nodule: Secondary | ICD-10-CM

## 2017-03-15 ENCOUNTER — Ambulatory Visit: Payer: BC Managed Care – PPO | Admitting: Obstetrics and Gynecology

## 2017-04-12 ENCOUNTER — Other Ambulatory Visit: Payer: Self-pay | Admitting: Family Medicine

## 2017-04-12 DIAGNOSIS — R9389 Abnormal findings on diagnostic imaging of other specified body structures: Secondary | ICD-10-CM

## 2017-04-12 DIAGNOSIS — R911 Solitary pulmonary nodule: Secondary | ICD-10-CM

## 2017-04-17 ENCOUNTER — Ambulatory Visit
Admission: RE | Admit: 2017-04-17 | Discharge: 2017-04-17 | Disposition: A | Discharge status: Home / Self Care | Payer: BC Managed Care – PPO | Source: Ambulatory Visit | Attending: Family Medicine | Admitting: Family Medicine

## 2017-04-17 DIAGNOSIS — R9389 Abnormal findings on diagnostic imaging of other specified body structures: Secondary | ICD-10-CM

## 2017-04-17 DIAGNOSIS — R911 Solitary pulmonary nodule: Secondary | ICD-10-CM

## 2017-04-19 ENCOUNTER — Other Ambulatory Visit (HOSPITAL_COMMUNITY): Payer: Self-pay | Admitting: Family Medicine

## 2017-04-19 DIAGNOSIS — R918 Other nonspecific abnormal finding of lung field: Secondary | ICD-10-CM

## 2017-04-19 DIAGNOSIS — R9389 Abnormal findings on diagnostic imaging of other specified body structures: Secondary | ICD-10-CM

## 2017-05-08 ENCOUNTER — Encounter (HOSPITAL_COMMUNITY)
Admission: RE | Admit: 2017-05-08 | Discharge: 2017-05-08 | Disposition: A | Discharge status: Home / Self Care | Payer: BC Managed Care – PPO | Source: Ambulatory Visit | Attending: Family Medicine | Admitting: Family Medicine

## 2017-05-08 DIAGNOSIS — R9389 Abnormal findings on diagnostic imaging of other specified body structures: Secondary | ICD-10-CM | POA: Diagnosis not present

## 2017-05-08 DIAGNOSIS — R918 Other nonspecific abnormal finding of lung field: Secondary | ICD-10-CM | POA: Diagnosis present

## 2017-05-08 LAB — GLUCOSE, CAPILLARY: Glucose-Capillary: 103 mg/dL — ABNORMAL HIGH (ref 65–99)

## 2017-05-08 MED ORDER — FLUDEOXYGLUCOSE F - 18 (FDG) INJECTION
9.4000 | Freq: Once | INTRAVENOUS | Status: AC | PRN
  Administered 2017-05-08: 9.4 via INTRAVENOUS

## 2017-05-22 ENCOUNTER — Institutional Professional Consult (permissible substitution): Payer: BC Managed Care – PPO | Admitting: Cardiothoracic Surgery

## 2017-05-22 ENCOUNTER — Encounter: Payer: Self-pay | Admitting: Cardiothoracic Surgery

## 2017-05-22 ENCOUNTER — Other Ambulatory Visit: Payer: Self-pay | Admitting: *Deleted

## 2017-05-22 ENCOUNTER — Other Ambulatory Visit: Payer: Self-pay

## 2017-05-22 VITALS — BP 150/94 | HR 99 | Resp 18 | Ht 67.0 in | Wt 197.8 lb

## 2017-05-22 DIAGNOSIS — R918 Other nonspecific abnormal finding of lung field: Secondary | ICD-10-CM

## 2017-05-22 NOTE — Progress Notes (Signed)
[address]       [place] [address]             [phone]                    Kristi E Ruffini [place] Medical Record #[id number] Date of Birth: 04-29-1956  Referring: Hulan Fess, MD Primary Care: Hulan Fess, MD Primary Cardiologist: No primary care provider on file.  Chief Complaint:    Chief Complaint  Patient presents with  . New Patient (Initial Visit)    Lung mass PET 05/08/2017, CT Chest 04/17/2017    History of Present Illness:    Shelby Becker 61 y.o. female is seen in the office  today at the request of Dr. Hulan Fess.  Patient's current problem started in 5498 following complications of cholecystectomy.  7 days postop she developed a biloma and and was incidentally found to have multiple pulmonary lung nodules at that time.  Subsequently she has had serial CT scans of the chest November 2016, April 2017 June 2018 December 2018.  PET scan was done recently because of the interval growth of approximately 3 mm .  The patient has no previous history of malignancy.  Colonoscopy was done less than a year ago with a small adenomatous polyp being removed. . Patient had a benign breast biopsy in the 1990s.   Patient has a remote smoking history smoking none more than half a pack a day from age 11 to age 61   Current Activity/ Functional Status:  Patient is independent with mobility/ambulation, transfers, ADL's, IADL's.   Zubrod Score: At the time of surgery this patient's most appropriate activity status/level should be described as: '[x]'     0    Normal activity, no symptoms '[]'     1    Restricted in physical strenuous activity but ambulatory, able to do out light work '[]'     2    Ambulatory and capable of self care, unable to do work activities, up and about               >50 % of waking hours                              '[]'     3    Only limited self care, in bed greater than 50% of waking hours '[]'     4    Completely disabled, no self care,  confined to bed or chair '[]'     5    Moribund   Past Medical History:  Diagnosis Date  . Bile duct leak    bile drainage tube in place.  . Chronic cholecystitis with calculus s/p lap chole 03/12/2015 03/22/2015  . Depression    Tx'd in 07/20/89 after a death of an infant  . Dyspareunia   . History of chronic cough    since gallbladder surgery-"fluid built up under right lung", "is improved"  . Hypertension     Past Surgical History:  Procedure Laterality Date  . BREAST SURGERY  1994   benign cyst removed left breast  . CESAREAN SECTION  1991  . CHOLECYSTECTOMY     8'16 laparoscopic- bile leak in abdomen, ERD visit -drain placed in to drain about 1 liter bile fluid  . LAPAROSCOPIC CHOLECYSTECTOMY SINGLE SITE WITH INTRAOPERATIVE CHOLANGIOGRAM  03/12/2015   Dr Johney Maine    Family History  Problem Relation Age  of Onset  . Thyroid disease Mother        hypothyroid  . Hypertension Father   . Thyroid disease Sister        Hashimotos  In addition patient has family history of ASD closure and a daughter at age 56, one sibling had a aortic valve replacement, patient's parents are both alive at age 9 without significant health problems  Social History   Socioeconomic History  . Marital status: Married  Tobacco Use  . Smoking status: Former Smoker    Last attempt to quit: 05/01/1984    Years since quitting: 33.0  . Smokeless tobacco: Never Used  Substance and Sexual Activity  . Alcohol use: No    Alcohol/week: 0.6 oz    Types: 1 Standard drinks or equivalent per week    Comment: once weekly to rare  . Drug use: No  . Sexual activity: Yes    Partners: Male    Birth control/protection: Post-menopausal    Social History   Tobacco Use  Smoking Status Former Smoker  . Last attempt to quit: 05/01/1984  . Years since quitting: 33.0  Smokeless Tobacco Never Used    Social History   Substance and Sexual Activity  Alcohol Use No  . Alcohol/week: 0.6 oz  . Types: 1 Standard drinks  or equivalent per week   Comment: once weekly to rare     Allergies  Allergen Reactions  . Nsaids Other (See Comments)    Low EGFR  . Bee Venom Swelling    Local swelling at site Has epipen    Current Outpatient Medications  Medication Sig Dispense Refill  . acetaminophen (TYLENOL) 325 MG tablet Take 650 mg by mouth every 6 (six) hours as needed for moderate pain.     Marland Kitchen EPIPEN 2-PAK 0.3 MG/0.3ML SOAJ injection Inject 0.3 mg into the muscle once as needed (allergic reaction).     Marland Kitchen losartan (COZAAR) 100 MG tablet Take 100 mg by mouth daily.    Marland Kitchen PROAIR HFA 108 (90 BASE) MCG/ACT inhaler Inhale 2 puffs into the lungs daily as needed (before exercising).      No current facility-administered medications for this visit.     Pertinent items are noted in HPI.   Review of Systems:  Review of Systems  Constitutional: Positive for weight loss (Patient notes 5 pound weight loss recently while on diet). Negative for chills, diaphoresis and malaise/fatigue.  HENT: Negative.   Eyes: Negative.   Respiratory: Positive for cough (Dry hacking cough for 2 years) and wheezing. Negative for hemoptysis, sputum production and shortness of breath.   Cardiovascular: Negative.   Gastrointestinal: Negative.  Negative for diarrhea.  Genitourinary: Negative.   Musculoskeletal: Negative.   Skin: Negative.   Neurological: Negative.  Negative for weakness.  Endo/Heme/Allergies: Negative.   Psychiatric/Behavioral: Negative.     Immunizations: Flu up to date [ y ]; Pneumococcal up to date Blue.Reese  ];    Physical Exam: BP (!) 150/94 (BP Location: Right Arm, Patient Position: Sitting, Cuff Size: Large)   Pulse 99   Resp 18   Ht '5\' 7"'  (1.702 m)   Wt 197 lb 12.8 oz (89.7 kg)   LMP 05/01/2004   SpO2 98% Comment: RA  BMI 30.98 kg/m   PHYSICAL EXAMINATION: General appearance: alert, cooperative and no distress Head: Normocephalic, without obvious abnormality, atraumatic Neck: no adenopathy, no carotid  bruit, no JVD, supple, symmetrical, trachea midline and thyroid not enlarged, symmetric, no tenderness/mass/nodules Lymph nodes: Cervical, supraclavicular, and axillary  nodes normal. Resp: clear to auscultation bilaterally Back: symmetric, no curvature. ROM normal. No CVA tenderness. Cardio: regular rate and rhythm, S1, S2 normal, no murmur, click, rub or gallop GI: soft, non-tender; bowel sounds normal; no masses,  no organomegaly Extremities: extremities normal, atraumatic, no cyanosis or edema Neurologic: Grossly normal  Diagnostic Studies & Laboratory data:     Recent Radiology Findings:   Nm Pet Image Initial (pi) Skull Base To Thigh  Result Date: 05/08/2017 CLINICAL DATA:  Initial treatment strategy for pulmonary nodules. EXAM: NUCLEAR MEDICINE PET SKULL BASE TO THIGH TECHNIQUE: 9.5 mCi F-18 FDG was injected intravenously. Full-ring PET imaging was performed from the skull base to thigh after the radiotracer. CT data was obtained and used for attenuation correction and anatomic localization. FASTING BLOOD GLUCOSE:  Value: 103 mg/dl COMPARISON:  CT 04/17/2017 FINDINGS: NECK No hypermetabolic lymph nodes in the neck. CHEST Bilateral pulmonary nodules which have metabolic activity. For example RIGHT upper lobe nodule measuring 11 mm (image 67 series 4) with SUV max 4 3. Medial LEFT lower lobe nodule measuring 9.0 mm (image 84 series 4) SUV max 4.0 some. Small nodules measurable metabolic activity No hypermetabolic mediastinal lymph nodes. No hypermetabolic axillary supraclavicular nodes ABDOMEN/PELVIS Hypermetabolic lesion along the anterior margin of the liver measures 2.6 cm by 2.1 (image 134, series 2. With SUV max equal 15.2. This lesion is subcapsular with mild extend into the hepatic parenchyma. This is same site is large post cholecystectomy biloma requiring drainage on CT of 04/06/2015 No additional hypermetabolic lesions liver. LEFT adrenal gland has mild metabolic activity just above liver  renal lesion. There is focal activity in the ascending colon associated with a mass like region bowel wall thickening measuring 2.5 cm ( image 13 series) with SUV max 7. Band of the bowel is normal. Uterus and ovaries appear radiotracer SKELETON No focal hypermetabolic activity to suggest skeletal metastasis. IMPRESSION: 1. Hypermetabolic lesion along the anterior margin of the liver at site of prior biloma. Favor chronic infection / inflammation however cannot exclude a concomitant carcinoma although less favored. 2. Hypermetabolic round pulmonary nodules concerning for pulmonary metastasis. 3. No clear primary lesion identified. Masslike thickening in the ascending colon could represent colorectal carcinoma versus nondistended bowel. No hypermetabolic abdominopelvic adenopathy present. 4. Question utility ofliver biopsy given with history of biloma and potential chronic infection. RIGHT upper lobe nodule biopsy may be most meaningful (image 63 series 5). Colonoscopy to evaluate the ascending colon may also be warranted. These results will be called to the ordering clinician or representative by the Radiologist Assistant, and communication documented in the PACS or zVision Dashboard. Electronically Signed   By: Suzy Bouchard M.D.   On: 05/08/2017 11:11    CLINICAL DATA:  Follow-up lung nodules  EXAM: CT CHEST WITHOUT CONTRAST  TECHNIQUE: Multidetector CT imaging of the chest was performed following the standard protocol without IV contrast.  COMPARISON:  10/18/2016, 08/03/2015, 03/22/2015  FINDINGS: Cardiovascular: Limited evaluation without intravenous contrast. No aneurysmal dilatation. Normal heart size. No pericardial effusion.  Mediastinum/Nodes: Midline trachea. No thyroid mass. Increased right cardio phrenic lymph node measuring 9 mm, compared with 6 mm previously. Esophagus within normal limits.  Lungs/Pleura: No pleural effusion or pneumothorax. Mild tree-in-bud nodularity in  the left upper lobe posteriorly, series 5, image number 24. 7 mm ground-glass density within the apex of the right upper lobe, series 5, image number 22, slow progression over time. Multiple bilateral lung nodules are again visualized. Index right lower lobe lesion measures 12 mm,  compared with 9 mm previously. 7 mm right middle lobe pulmonary nodule, series 5, image number 78, compared with 4 mm previously. 9 mm pulmonary nodule medial right lung base, series 5 image number 101, previously 8 mm. Medial left lower lobe pulmonary nodule, series 5, image number 104 measures 12 mm, compared with 8 mm on more remote examinations and 11 mm on most recent prior. Focal scarring in the right middle lobe anteriorly is unchanged.  Upper Abdomen: Stable subcentimeter hypodensity in the left hepatic lobe. Surgical clips in the right upper quadrant.  Musculoskeletal: No acute or suspicious bone lesion. Degenerative changes. Asymmetric density in the right sub areolar and outer breast compare to contralateral left breast. No recent mammographic imaging.  IMPRESSION: 1. There are multiple bilateral pulmonary nodules, several of which have shown interval growth since the previous exams. Further evaluation with PET-CT is currently recommended. Also slight interval enlargement of a right cardio phrenic lymph nodes since the prior studies. 2. Mild posterior left upper lobe nodularity is suspect for respiratory infection. 3. Asymmetric density in the right subareolar and outer breasts. Recommend correlation with mammographic imaging. These results will be called to the ordering clinician or representative by the Radiologist Assistant, and communication documented in the PACS or zVision Dashboard.   Electronically Signed   By: Donavan Foil M.D.   On: 04/17/2017 19:17  I have independently reviewed the above radiology studies and compared them to previous scans in 2016 and 2017 and reviewed  the findings with the patient.   Recent Lab Findings: Lab Results  Component Value Date   WBC 8.6 03/26/2015   HGB 11.7 (L) 03/26/2015   HCT 34.7 (L) 03/26/2015   PLT 409 (H) 03/26/2015   GLUCOSE 105 (H) 03/24/2015   ALT 153 (H) 03/24/2015   AST 33 03/24/2015   NA 136 03/24/2015   K 4.7 03/24/2015   CL 105 03/24/2015   CREATININE 0.96 03/24/2015   BUN 12 03/24/2015   CO2 24 03/24/2015   INR 1.14 03/23/2015      Assessment / Plan:   Patient with multiple small pulmonary nodules bilaterally present for at least 2 years first noted on incidental CT scan after complications from cholecystectomy, no previous history of malignancy.  PET scan suggests mild metabolic activity in the nodules.  I discussed with the patient various biopsy methods including CT-guided needle biopsy or navigation bronchoscopy or wedge resection for an excisional biopsy.  We will plan on obtaining pulmonary function studies, get the super D disc of her most recent CT scan to investigate the feasibility of navigation bronchoscopy.  Will review the series of CT scans and PET scan with radiology at the multidisciplinary thoracic oncology conference this week.,  And obtain opinion from interventional radiology about needle biopsy of the right lung lesion.  Will ask Dr. Glenna Fellows review the PET scan in regards to need for repeat colonoscopy at this time.  I plan to see the patient back next week and will make a decision about means of biopsy versus surgical resection.      I  spent 60 minutes counseling the patient face to face.   Grace Isaac MD      [address] [place] [address] Office [phone]   Beeper [phone]  05/22/2017 4:31 PM

## 2017-05-24 ENCOUNTER — Encounter: Payer: BC Managed Care – PPO | Admitting: Cardiothoracic Surgery

## 2017-05-28 ENCOUNTER — Ambulatory Visit (HOSPITAL_COMMUNITY)
Admission: RE | Admit: 2017-05-28 | Discharge: 2017-05-28 | Disposition: A | Discharge status: Home / Self Care | Payer: BC Managed Care – PPO | Source: Ambulatory Visit | Attending: Cardiothoracic Surgery | Admitting: Cardiothoracic Surgery

## 2017-05-28 DIAGNOSIS — R918 Other nonspecific abnormal finding of lung field: Secondary | ICD-10-CM | POA: Insufficient documentation

## 2017-05-28 LAB — PULMONARY FUNCTION TEST
DL/VA % pred: 78 %
DL/VA: 4.03 ml/min/mmHg/L
DLCO unc % pred: 66 %
DLCO unc: 18.77 ml/min/mmHg
FEF 25-75 Post: 1.03 L/sec
FEF 25-75 Pre: 1.03 L/sec
FEF2575-%Change-Post: 0 %
FEF2575-%Pred-Post: 43 %
FEF2575-%Pred-Pre: 43 %
FEV1-%Change-Post: 0 %
FEV1-%Pred-Post: 70 %
FEV1-%Pred-Pre: 71 %
FEV1-Post: 1.95 L
FEV1-Pre: 1.97 L
FEV1FVC-%Change-Post: 4 %
FEV1FVC-%Pred-Pre: 83 %
FEV6-%Change-Post: -4 %
FEV6-%Pred-Post: 80 %
FEV6-%Pred-Pre: 84 %
FEV6-Post: 2.8 L
FEV6-Pre: 2.94 L
FEV6FVC-%Change-Post: 0 %
FEV6FVC-%Pred-Post: 100 %
FEV6FVC-%Pred-Pre: 100 %
FVC-%Change-Post: -5 %
FVC-%Pred-Post: 80 %
FVC-%Pred-Pre: 85 %
FVC-Post: 2.9 L
FVC-Pre: 3.06 L
Post FEV1/FVC ratio: 67 %
Post FEV6/FVC ratio: 97 %
Pre FEV1/FVC ratio: 64 %
Pre FEV6/FVC Ratio: 96 %
RV % pred: 104 %
RV: 2.29 L
TLC % pred: 99 %
TLC: 5.48 L

## 2017-05-28 MED ORDER — ALBUTEROL SULFATE (2.5 MG/3ML) 0.083% IN NEBU
2.5000 mg | INHALATION_SOLUTION | Freq: Once | RESPIRATORY_TRACT | Status: AC
  Administered 2017-05-28: 2.5 mg via RESPIRATORY_TRACT

## 2017-05-31 ENCOUNTER — Other Ambulatory Visit: Payer: Self-pay | Admitting: *Deleted

## 2017-05-31 ENCOUNTER — Ambulatory Visit: Payer: BC Managed Care – PPO | Admitting: Cardiothoracic Surgery

## 2017-05-31 ENCOUNTER — Encounter: Payer: Self-pay | Admitting: Cardiothoracic Surgery

## 2017-05-31 VITALS — HR 80 | Resp 20 | Ht 67.0 in | Wt 195.0 lb

## 2017-05-31 DIAGNOSIS — R918 Other nonspecific abnormal finding of lung field: Secondary | ICD-10-CM | POA: Diagnosis not present

## 2017-05-31 DIAGNOSIS — R911 Solitary pulmonary nodule: Secondary | ICD-10-CM

## 2017-05-31 NOTE — Progress Notes (Signed)
[address]       [place], [address]             [phone]                    Shondrea E Hur [facility] #[id number] Date of Birth: Jun 06, 1955  Referring: Hulan Fess, MD Primary Care: Hulan Fess, MD Primary Cardiologist: No primary care provider on file.  Chief Complaint:    Chief Complaint  Patient presents with  . Lung Lesion    Further discuss surgery    History of Present Illness:    Shelby Becker 61 y.o. female is seen in the office 10 days ago at the request of Dr. Hulan Fess.  Patient's current problem started in 2947 following complications of cholecystectomy.  7 days postop she developed a biloma and and was incidentally found to have multiple pulmonary lung nodules at that time.  Subsequently she has had serial CT scans of the chest November 2016, April 2017 June 2018 December 2018.  PET scan was done recently because of the interval growth of approximately 3 mm .  The patient has no previous history of malignancy.  Colonoscopy was done less than a year ago with a small adenomatous polyp being removed. . Patient had a benign breast biopsy in the 1990s.   Patient has a remote smoking history smoking none more than half a pack a day from age 58 to age 89  Pulmonary function studies have been done  Current Activity/ Functional Status:  Patient is independent with mobility/ambulation, transfers, ADL's, IADL's.   Zubrod Score: At the time of surgery this patient's most appropriate activity status/level should be described as: '[x]'     0    Normal activity, no symptoms '[]'     1    Restricted in physical strenuous activity but ambulatory, able to do out light work '[]'     2    Ambulatory and capable of self care, unable to do work activities, up and about               >50 % of waking hours                              '[]'     3    Only limited self care, in bed greater than 50% of waking hours '[]'     4    Completely disabled, no  self care, confined to bed or chair '[]'     5    Moribund   Past Medical History:  Diagnosis Date  . Bile duct leak    bile drainage tube in place.  . Chronic cholecystitis with calculus s/p lap chole 03/12/2015 03/22/2015  . Depression    Tx'd in 07/01/89 after a death of an infant  . Dyspareunia   . History of chronic cough    since gallbladder surgery-"fluid built up under right lung", "is improved"  . Hypertension     Past Surgical History:  Procedure Laterality Date  . BREAST SURGERY  1994   benign cyst removed left breast  . CESAREAN SECTION  1991  . CHOLECYSTECTOMY     8'16 laparoscopic- bile leak in abdomen, ERD visit -drain placed in to drain about 1 liter bile fluid  . LAPAROSCOPIC CHOLECYSTECTOMY SINGLE SITE WITH INTRAOPERATIVE CHOLANGIOGRAM  03/12/2015   Dr Johney Maine    Family History  Problem Relation  Age of Onset  . Thyroid disease Mother        hypothyroid  . Hypertension Father   . Thyroid disease Sister        Hashimotos  In addition patient has family history of ASD closure and a daughter at age 53, one sibling had a aortic valve replacement, patient's parents are both alive at age 79 without significant health problems  Social History   Socioeconomic History  . Marital status: Married  Tobacco Use  . Smoking status: Former Smoker    Last attempt to quit: 05/01/1984    Years since quitting: 33.0  . Smokeless tobacco: Never Used  Substance and Sexual Activity  . Alcohol use: No    Alcohol/week: 0.6 oz    Types: 1 Standard drinks or equivalent per week    Comment: once weekly to rare  . Drug use: No  . Sexual activity: Yes    Partners: Male    Birth control/protection: Post-menopausal    Social History   Tobacco Use  Smoking Status Former Smoker  . Last attempt to quit: 05/01/1984  . Years since quitting: 33.1  Smokeless Tobacco Never Used    Social History   Substance and Sexual Activity  Alcohol Use No  . Alcohol/week: 0.6 oz  . Types: 1  Standard drinks or equivalent per week   Comment: once weekly to rare     Allergies  Allergen Reactions  . Nsaids Other (See Comments)    Low EGFR  . Bee Venom Swelling    Local swelling at site Has epipen    Current Outpatient Medications  Medication Sig Dispense Refill  . acetaminophen (TYLENOL) 325 MG tablet Take 650 mg by mouth every 6 (six) hours as needed for moderate pain.     Marland Kitchen EPIPEN 2-PAK 0.3 MG/0.3ML SOAJ injection Inject 0.3 mg into the muscle once as needed (allergic reaction).     Marland Kitchen losartan (COZAAR) 100 MG tablet Take 100 mg by mouth daily.    Marland Kitchen PROAIR HFA 108 (90 BASE) MCG/ACT inhaler Inhale 2 puffs into the lungs daily as needed (before exercising).      No current facility-administered medications for this visit.     Pertinent items are noted in HPI.   Review of Systems:  Review of Systems  Constitutional: Positive for weight loss. Negative for chills, diaphoresis, fever and malaise/fatigue.  HENT: Negative.   Respiratory: Positive for cough. Negative for hemoptysis, sputum production, shortness of breath and wheezing.   Cardiovascular: Negative.   Gastrointestinal: Negative.   Genitourinary: Negative.   Musculoskeletal: Negative.   Skin: Negative.   Neurological: Negative.  Negative for weakness.  Endo/Heme/Allergies: Negative.   Psychiatric/Behavioral: Negative.     Immunizations: Flu up to date [ y ]; Pneumococcal up to date Blue.Reese  ];    Physical Exam: Pulse 80   Resp 20   Ht '5\' 7"'  (1.702 m)   Wt 195 lb (88.5 kg)   LMP 05/01/2004   SpO2 96% Comment: RA  BMI 30.54 kg/m   PHYSICAL EXAMINATION: General appearance: alert and cooperative Head: Normocephalic, without obvious abnormality, atraumatic Neck: no adenopathy, no carotid bruit, no JVD, supple, symmetrical, trachea midline and thyroid not enlarged, symmetric, no tenderness/mass/nodules Lymph nodes: Cervical, supraclavicular, and axillary nodes normal. Resp: clear to auscultation  bilaterally Back: symmetric, no curvature. ROM normal. No CVA tenderness. Cardio: regular rate and rhythm, S1, S2 normal, no murmur, click, rub or gallop GI: soft, non-tender; bowel sounds normal; no masses,  no organomegaly  Extremities: extremities normal, atraumatic, no cyanosis or edema and Homans sign is negative, no sign of DVT Neurologic: Grossly normal  Diagnostic Studies & Laboratory data:    Recent Radiology Findings:   Nm Pet Image Initial (pi) Skull Base To Thigh  Result Date: 05/08/2017 CLINICAL DATA:  Initial treatment strategy for pulmonary nodules. EXAM: NUCLEAR MEDICINE PET SKULL BASE TO THIGH TECHNIQUE: 9.5 mCi F-18 FDG was injected intravenously. Full-ring PET imaging was performed from the skull base to thigh after the radiotracer. CT data was obtained and used for attenuation correction and anatomic localization. FASTING BLOOD GLUCOSE:  Value: 103 mg/dl COMPARISON:  CT 04/17/2017 FINDINGS: NECK No hypermetabolic lymph nodes in the neck. CHEST Bilateral pulmonary nodules which have metabolic activity. For example RIGHT upper lobe nodule measuring 11 mm (image 67 series 4) with SUV max 4 3. Medial LEFT lower lobe nodule measuring 9.0 mm (image 84 series 4) SUV max 4.0 some. Small nodules measurable metabolic activity No hypermetabolic mediastinal lymph nodes. No hypermetabolic axillary supraclavicular nodes ABDOMEN/PELVIS Hypermetabolic lesion along the anterior margin of the liver measures 2.6 cm by 2.1 (image 134, series 2. With SUV max equal 15.2. This lesion is subcapsular with mild extend into the hepatic parenchyma. This is same site is large post cholecystectomy biloma requiring drainage on CT of 04/06/2015 No additional hypermetabolic lesions liver. LEFT adrenal gland has mild metabolic activity just above liver renal lesion. There is focal activity in the ascending colon associated with a mass like region bowel wall thickening measuring 2.5 cm ( image 13 series) with SUV max 7.  Band of the bowel is normal. Uterus and ovaries appear radiotracer SKELETON No focal hypermetabolic activity to suggest skeletal metastasis. IMPRESSION: 1. Hypermetabolic lesion along the anterior margin of the liver at site of prior biloma. Favor chronic infection / inflammation however cannot exclude a concomitant carcinoma although less favored. 2. Hypermetabolic round pulmonary nodules concerning for pulmonary metastasis. 3. No clear primary lesion identified. Masslike thickening in the ascending colon could represent colorectal carcinoma versus nondistended bowel. No hypermetabolic abdominopelvic adenopathy present. 4. Question utility ofliver biopsy given with history of biloma and potential chronic infection. RIGHT upper lobe nodule biopsy may be most meaningful (image 63 series 5). Colonoscopy to evaluate the ascending colon may also be warranted. These results will be called to the ordering clinician or representative by the Radiologist Assistant, and communication documented in the PACS or zVision Dashboard. Electronically Signed   By: Suzy Bouchard M.D.   On: 05/08/2017 11:11   I have independently reviewed the above radiology studies  and reviewed the findings with the patient.     CLINICAL DATA:  Follow-up lung nodules  EXAM: CT CHEST WITHOUT CONTRAST  TECHNIQUE: Multidetector CT imaging of the chest was performed following the standard protocol without IV contrast.  COMPARISON:  10/18/2016, 08/03/2015, 03/22/2015  FINDINGS: Cardiovascular: Limited evaluation without intravenous contrast. No aneurysmal dilatation. Normal heart size. No pericardial effusion.  Mediastinum/Nodes: Midline trachea. No thyroid mass. Increased right cardio phrenic lymph node measuring 9 mm, compared with 6 mm previously. Esophagus within normal limits.  Lungs/Pleura: No pleural effusion or pneumothorax. Mild tree-in-bud nodularity in the left upper lobe posteriorly, series 5, image number  24. 7 mm ground-glass density within the apex of the right upper lobe, series 5, image number 22, slow progression over time. Multiple bilateral lung nodules are again visualized. Index right lower lobe lesion measures 12 mm, compared with 9 mm previously. 7 mm right middle lobe pulmonary nodule, series 5,  image number 78, compared with 4 mm previously. 9 mm pulmonary nodule medial right lung base, series 5 image number 101, previously 8 mm. Medial left lower lobe pulmonary nodule, series 5, image number 104 measures 12 mm, compared with 8 mm on more remote examinations and 11 mm on most recent prior. Focal scarring in the right middle lobe anteriorly is unchanged.  Upper Abdomen: Stable subcentimeter hypodensity in the left hepatic lobe. Surgical clips in the right upper quadrant.  Musculoskeletal: No acute or suspicious bone lesion. Degenerative changes. Asymmetric density in the right sub areolar and outer breast compare to contralateral left breast. No recent mammographic imaging.  IMPRESSION: 1. There are multiple bilateral pulmonary nodules, several of which have shown interval growth since the previous exams. Further evaluation with PET-CT is currently recommended. Also slight interval enlargement of a right cardio phrenic lymph nodes since the prior studies. 2. Mild posterior left upper lobe nodularity is suspect for respiratory infection. 3. Asymmetric density in the right subareolar and outer breasts. Recommend correlation with mammographic imaging. These results will be called to the ordering clinician or representative by the Radiologist Assistant, and communication documented in the PACS or zVision Dashboard.   Electronically Signed   By: Donavan Foil M.D.   On: 04/17/2017 19:17  I have independently reviewed the above radiology studies and compared them to previous scans in 2016 and 2017 and reviewed the findings with the patient.   Recent Lab  Findings: Lab Results  Component Value Date   WBC 8.6 03/26/2015   HGB 11.7 (L) 03/26/2015   HCT 34.7 (L) 03/26/2015   PLT 409 (H) 03/26/2015   GLUCOSE 105 (H) 03/24/2015   ALT 153 (H) 03/24/2015   AST 33 03/24/2015   NA 136 03/24/2015   K 4.7 03/24/2015   CL 105 03/24/2015   CREATININE 0.96 03/24/2015   BUN 12 03/24/2015   CO2 24 03/24/2015   INR 1.14 03/23/2015   PFT's : FEV1 1.97 71%  DLCO 18.77 66%  Interpretation: The FEV1, FEV1/FVC ratio and FEF25-75% are reduced indicating airway obstruction. Lung volumes are within normal limits. Following administration of bronchodilators, there is no significant response. The reduced diffusing capacity indicates a mild loss of functional alveolar capillary surface. However, the diffusing capacity was not corrected for the patient's hemoglobin. Pulmonary Function Diagnosis: Mild Obstructive Airways Disease Mild Diffusion Defect   Assessment / Plan:   Patient with multiple small pulmonary nodules bilaterally present for at least 2 years first noted on incidental CT scan after complications from cholecystectomy, no previous history of malignancy.  PET scan suggests mild metabolic activity in the nodules.  I discussed with the patient various biopsy methods including CT-guided needle biopsy or navigation bronchoscopy or wedge resection for an excisional biopsy.   We will plan on February 8 proceeding with navigation bronchoscopy to the 2 lesions on the right and the lesion on the left.    Dr. Christel Mormon the PET scan in regards to need for repeat colonoscopy -this is planned for early next week      Grace Isaac MD      [address] [place] [address] [place] [address] Office [phone]   Beeper [phone]  05/31/2017 12:49 PM

## 2017-06-07 ENCOUNTER — Encounter (HOSPITAL_COMMUNITY): Payer: Self-pay

## 2017-06-07 ENCOUNTER — Other Ambulatory Visit: Payer: Self-pay

## 2017-06-07 NOTE — Progress Notes (Signed)
PCP - Dr. Hulan Fess Cardiologist - patient denies  Chest x-ray - DOS EKG - DOS Stress Test - patient denies  ECHO - patient denies Cardiac Cath - patient denies  Sleep Study - patient denies   Patient denies shortness of breath, fever, cough and chest pain during pre-op call. Patient verbalized understanding of instructions that were given to them over the phone.

## 2017-06-08 ENCOUNTER — Ambulatory Visit (HOSPITAL_COMMUNITY)
Admission: RE | Admit: 2017-06-08 | Discharge: 2017-06-08 | Disposition: A | Discharge status: Home / Self Care | Payer: BC Managed Care – PPO | Source: Ambulatory Visit | Attending: Cardiothoracic Surgery | Admitting: Cardiothoracic Surgery

## 2017-06-08 ENCOUNTER — Ambulatory Visit (HOSPITAL_COMMUNITY): Payer: BC Managed Care – PPO

## 2017-06-08 ENCOUNTER — Ambulatory Visit (HOSPITAL_COMMUNITY): Payer: BC Managed Care – PPO | Admitting: Certified Registered Nurse Anesthetist

## 2017-06-08 ENCOUNTER — Encounter (HOSPITAL_COMMUNITY)
Admission: RE | Disposition: A | Discharge status: Home / Self Care | Payer: Self-pay | Source: Ambulatory Visit | Attending: Cardiothoracic Surgery

## 2017-06-08 ENCOUNTER — Encounter (HOSPITAL_COMMUNITY): Payer: Self-pay | Admitting: Surgery

## 2017-06-08 DIAGNOSIS — N941 Unspecified dyspareunia: Secondary | ICD-10-CM | POA: Diagnosis not present

## 2017-06-08 DIAGNOSIS — Z01818 Encounter for other preprocedural examination: Secondary | ICD-10-CM

## 2017-06-08 DIAGNOSIS — Z9049 Acquired absence of other specified parts of digestive tract: Secondary | ICD-10-CM | POA: Insufficient documentation

## 2017-06-08 DIAGNOSIS — Z87891 Personal history of nicotine dependence: Secondary | ICD-10-CM | POA: Insufficient documentation

## 2017-06-08 DIAGNOSIS — J45909 Unspecified asthma, uncomplicated: Secondary | ICD-10-CM | POA: Insufficient documentation

## 2017-06-08 DIAGNOSIS — I7300 Raynaud's syndrome without gangrene: Secondary | ICD-10-CM | POA: Insufficient documentation

## 2017-06-08 DIAGNOSIS — Z886 Allergy status to analgesic agent status: Secondary | ICD-10-CM | POA: Diagnosis not present

## 2017-06-08 DIAGNOSIS — F329 Major depressive disorder, single episode, unspecified: Secondary | ICD-10-CM | POA: Diagnosis not present

## 2017-06-08 DIAGNOSIS — R918 Other nonspecific abnormal finding of lung field: Secondary | ICD-10-CM | POA: Diagnosis not present

## 2017-06-08 DIAGNOSIS — Z86010 Personal history of colonic polyps: Secondary | ICD-10-CM | POA: Insufficient documentation

## 2017-06-08 DIAGNOSIS — R911 Solitary pulmonary nodule: Secondary | ICD-10-CM

## 2017-06-08 DIAGNOSIS — I739 Peripheral vascular disease, unspecified: Secondary | ICD-10-CM | POA: Insufficient documentation

## 2017-06-08 DIAGNOSIS — Z8249 Family history of ischemic heart disease and other diseases of the circulatory system: Secondary | ICD-10-CM | POA: Diagnosis not present

## 2017-06-08 DIAGNOSIS — Z419 Encounter for procedure for purposes other than remedying health state, unspecified: Secondary | ICD-10-CM

## 2017-06-08 DIAGNOSIS — Z888 Allergy status to other drugs, medicaments and biological substances status: Secondary | ICD-10-CM | POA: Insufficient documentation

## 2017-06-08 DIAGNOSIS — Z09 Encounter for follow-up examination after completed treatment for conditions other than malignant neoplasm: Secondary | ICD-10-CM

## 2017-06-08 DIAGNOSIS — I10 Essential (primary) hypertension: Secondary | ICD-10-CM | POA: Diagnosis not present

## 2017-06-08 DIAGNOSIS — Z8349 Family history of other endocrine, nutritional and metabolic diseases: Secondary | ICD-10-CM | POA: Diagnosis not present

## 2017-06-08 DIAGNOSIS — Z91030 Bee allergy status: Secondary | ICD-10-CM | POA: Insufficient documentation

## 2017-06-08 DIAGNOSIS — R05 Cough: Secondary | ICD-10-CM | POA: Insufficient documentation

## 2017-06-08 DIAGNOSIS — N6489 Other specified disorders of breast: Secondary | ICD-10-CM | POA: Insufficient documentation

## 2017-06-08 HISTORY — DX: Pneumonia, unspecified organism: J18.9

## 2017-06-08 HISTORY — PX: LUNG BIOPSY: SHX5088

## 2017-06-08 HISTORY — DX: Unspecified asthma, uncomplicated: J45.909

## 2017-06-08 HISTORY — PX: VIDEO BRONCHOSCOPY WITH ENDOBRONCHIAL NAVIGATION: SHX6175

## 2017-06-08 HISTORY — DX: Raynaud's syndrome without gangrene: I73.00

## 2017-06-08 LAB — PROTIME-INR
INR: 0.93
Prothrombin Time: 12.3 seconds (ref 11.4–15.2)

## 2017-06-08 LAB — CBC
HCT: 44.7 % (ref 36.0–46.0)
Hemoglobin: 15.3 g/dL — ABNORMAL HIGH (ref 12.0–15.0)
MCH: 30.9 pg (ref 26.0–34.0)
MCHC: 34.2 g/dL (ref 30.0–36.0)
MCV: 90.3 fL (ref 78.0–100.0)
Platelets: 213 10*3/uL (ref 150–400)
RBC: 4.95 MIL/uL (ref 3.87–5.11)
RDW: 13.7 % (ref 11.5–15.5)
WBC: 8.4 10*3/uL (ref 4.0–10.5)

## 2017-06-08 LAB — COMPREHENSIVE METABOLIC PANEL
ALT: 25 U/L (ref 14–54)
AST: 23 U/L (ref 15–41)
Albumin: 4.1 g/dL (ref 3.5–5.0)
Alkaline Phosphatase: 117 U/L (ref 38–126)
Anion gap: 12 (ref 5–15)
BUN: 15 mg/dL (ref 6–20)
CO2: 20 mmol/L — ABNORMAL LOW (ref 22–32)
Calcium: 9.6 mg/dL (ref 8.9–10.3)
Chloride: 108 mmol/L (ref 101–111)
Creatinine, Ser: 1.05 mg/dL — ABNORMAL HIGH (ref 0.44–1.00)
GFR calc Af Amer: >60 mL/min (ref >60)
GFR calc non Af Amer: 55 mL/min — ABNORMAL LOW (ref >60)
Glucose, Bld: 101 mg/dL — ABNORMAL HIGH (ref 65–99)
Potassium: 4.2 mmol/L (ref 3.5–5.1)
Sodium: 140 mmol/L (ref 135–145)
Total Bilirubin: 0.9 mg/dL (ref 0.3–1.2)
Total Protein: 7 g/dL (ref 6.5–8.1)

## 2017-06-08 LAB — APTT: aPTT: 28 seconds (ref 24–36)

## 2017-06-08 SURGERY — VIDEO BRONCHOSCOPY WITH ENDOBRONCHIAL NAVIGATION
Anesthesia: General

## 2017-06-08 MED ORDER — LACTATED RINGERS IV SOLN: INTRAVENOUS | Status: DC

## 2017-06-08 MED ORDER — FENTANYL CITRATE (PF) 250 MCG/5ML IJ SOLN
INTRAMUSCULAR | Status: AC
  Filled 2017-06-08: qty 5

## 2017-06-08 MED ORDER — FENTANYL CITRATE (PF) 100 MCG/2ML IJ SOLN
INTRAMUSCULAR | Status: DC | PRN
  Administered 2017-06-08: 50 ug via INTRAVENOUS
  Administered 2017-06-08: 100 ug via INTRAVENOUS

## 2017-06-08 MED ORDER — PROPOFOL 10 MG/ML IV BOLUS
INTRAVENOUS | Status: AC
  Filled 2017-06-08: qty 20

## 2017-06-08 MED ORDER — EPINEPHRINE PF 1 MG/ML IJ SOLN
INTRAMUSCULAR | Status: AC
  Filled 2017-06-08: qty 1

## 2017-06-08 MED ORDER — SUGAMMADEX SODIUM 200 MG/2ML IV SOLN
INTRAVENOUS | Status: AC
  Filled 2017-06-08: qty 2

## 2017-06-08 MED ORDER — CEFUROXIME SODIUM 1.5 G IV SOLR
INTRAVENOUS | Status: AC
  Filled 2017-06-08: qty 1.5

## 2017-06-08 MED ORDER — ROCURONIUM BROMIDE 10 MG/ML (PF) SYRINGE
PREFILLED_SYRINGE | INTRAVENOUS | Status: AC
  Filled 2017-06-08: qty 5

## 2017-06-08 MED ORDER — PROMETHAZINE HCL 25 MG/ML IJ SOLN
6.2500 mg | INTRAMUSCULAR | Status: DC | PRN
  Administered 2017-06-08: 6.25 mg via INTRAVENOUS

## 2017-06-08 MED ORDER — PHENYLEPHRINE 40 MCG/ML (10ML) SYRINGE FOR IV PUSH (FOR BLOOD PRESSURE SUPPORT)
PREFILLED_SYRINGE | INTRAVENOUS | Status: AC
  Filled 2017-06-08: qty 10

## 2017-06-08 MED ORDER — 0.9 % SODIUM CHLORIDE (POUR BTL) OPTIME
TOPICAL | Status: DC | PRN
  Administered 2017-06-08: 1000 mL

## 2017-06-08 MED ORDER — HYDROMORPHONE HCL 1 MG/ML IJ SOLN: 0.2500 mg | INTRAMUSCULAR | Status: DC | PRN

## 2017-06-08 MED ORDER — LACTATED RINGERS IV SOLN
INTRAVENOUS | Status: DC
  Administered 2017-06-08: 09:00:00 via INTRAVENOUS

## 2017-06-08 MED ORDER — EPHEDRINE SULFATE-NACL 50-0.9 MG/10ML-% IV SOSY
PREFILLED_SYRINGE | INTRAVENOUS | Status: DC | PRN
  Administered 2017-06-08: 5 mg via INTRAVENOUS
  Administered 2017-06-08: 10 mg via INTRAVENOUS
  Administered 2017-06-08: 5 mg via INTRAVENOUS

## 2017-06-08 MED ORDER — ONDANSETRON HCL 4 MG/2ML IJ SOLN
INTRAMUSCULAR | Status: DC | PRN
  Administered 2017-06-08: 4 mg via INTRAVENOUS

## 2017-06-08 MED ORDER — PHENYLEPHRINE 40 MCG/ML (10ML) SYRINGE FOR IV PUSH (FOR BLOOD PRESSURE SUPPORT)
PREFILLED_SYRINGE | INTRAVENOUS | Status: DC | PRN
  Administered 2017-06-08: 80 ug via INTRAVENOUS

## 2017-06-08 MED ORDER — MEPERIDINE HCL 50 MG/ML IJ SOLN: 6.2500 mg | INTRAMUSCULAR | Status: DC | PRN

## 2017-06-08 MED ORDER — PROPOFOL 10 MG/ML IV BOLUS
INTRAVENOUS | Status: DC | PRN
  Administered 2017-06-08: 150 mg via INTRAVENOUS
  Administered 2017-06-08 (×2): 20 mg via INTRAVENOUS

## 2017-06-08 MED ORDER — ONDANSETRON HCL 4 MG/2ML IJ SOLN
INTRAMUSCULAR | Status: AC
  Filled 2017-06-08: qty 2

## 2017-06-08 MED ORDER — PHENYLEPHRINE HCL 10 MG/ML IJ SOLN
INTRAVENOUS | Status: DC | PRN
  Administered 2017-06-08: 10 ug/min via INTRAVENOUS

## 2017-06-08 MED ORDER — MIDAZOLAM HCL 5 MG/5ML IJ SOLN
INTRAMUSCULAR | Status: DC | PRN
  Administered 2017-06-08: 2 mg via INTRAVENOUS

## 2017-06-08 MED ORDER — DEXAMETHASONE SODIUM PHOSPHATE 10 MG/ML IJ SOLN
INTRAMUSCULAR | Status: DC | PRN
  Administered 2017-06-08: 10 mg via INTRAVENOUS

## 2017-06-08 MED ORDER — DEXAMETHASONE SODIUM PHOSPHATE 10 MG/ML IJ SOLN
INTRAMUSCULAR | Status: AC
  Filled 2017-06-08: qty 1

## 2017-06-08 MED ORDER — ROCURONIUM BROMIDE 10 MG/ML (PF) SYRINGE
PREFILLED_SYRINGE | INTRAVENOUS | Status: DC | PRN
  Administered 2017-06-08: 20 mg via INTRAVENOUS
  Administered 2017-06-08 (×2): 10 mg via INTRAVENOUS
  Administered 2017-06-08: 50 mg via INTRAVENOUS
  Administered 2017-06-08 (×2): 10 mg via INTRAVENOUS

## 2017-06-08 MED ORDER — LIDOCAINE 2% (20 MG/ML) 5 ML SYRINGE
INTRAMUSCULAR | Status: DC | PRN
  Administered 2017-06-08: 50 mg via INTRAVENOUS

## 2017-06-08 MED ORDER — PROMETHAZINE HCL 25 MG/ML IJ SOLN
INTRAMUSCULAR | Status: AC
  Filled 2017-06-08: qty 1

## 2017-06-08 MED ORDER — EPHEDRINE 5 MG/ML INJ
INTRAVENOUS | Status: AC
  Filled 2017-06-08: qty 10

## 2017-06-08 MED ORDER — SUGAMMADEX SODIUM 200 MG/2ML IV SOLN
INTRAVENOUS | Status: DC | PRN
  Administered 2017-06-08: 200 mg via INTRAVENOUS

## 2017-06-08 MED ORDER — DEXTROSE 5 % IV SOLN
INTRAVENOUS | Status: DC | PRN
  Administered 2017-06-08: 1.5 g via INTRAVENOUS

## 2017-06-08 MED ORDER — LIDOCAINE 2% (20 MG/ML) 5 ML SYRINGE
INTRAMUSCULAR | Status: AC
  Filled 2017-06-08: qty 5

## 2017-06-08 MED ORDER — MIDAZOLAM HCL 2 MG/2ML IJ SOLN
INTRAMUSCULAR | Status: AC
  Filled 2017-06-08: qty 2

## 2017-06-08 SURGICAL SUPPLY — 38 items
ADAPTER BRONCH F/PENTAX (ADAPTER) ×2 IMPLANT
ADPR BSCP EDG PNTX (ADAPTER) ×1
BRUSH BIOPSY BRONCH 10 SDTNB (MISCELLANEOUS) ×2 IMPLANT
BRUSH CYTOL CELLEBRITY 1.5X140 (MISCELLANEOUS) ×1 IMPLANT
BRUSH SUPERTRAX BIOPSY (INSTRUMENTS) IMPLANT
BRUSH SUPERTRAX NDL-TIP CYTO (INSTRUMENTS) ×3 IMPLANT
CANISTER SUCT 3000ML PPV (MISCELLANEOUS) ×2 IMPLANT
CHANNEL WORK EXTEND EDGE 180 (KITS) IMPLANT
CHANNEL WORK EXTEND EDGE 45 (KITS) IMPLANT
CHANNEL WORK EXTEND EDGE 90 (KITS) IMPLANT
CONT SPEC 4OZ CLIKSEAL STRL BL (MISCELLANEOUS) ×5 IMPLANT
COVER BACK TABLE 60X90IN (DRAPES) ×2 IMPLANT
FILTER STRAW FLUID ASPIR (MISCELLANEOUS) IMPLANT
FORCEPS BIOP SUPERTRX PREMAR (INSTRUMENTS) ×3 IMPLANT
GAUZE SPONGE 4X4 12PLY STRL (GAUZE/BANDAGES/DRESSINGS) ×2 IMPLANT
GLOVE BIO SURGEON STRL SZ 6.5 (GLOVE) ×2 IMPLANT
GLOVE SURG SS PI 6.5 STRL IVOR (GLOVE) ×2 IMPLANT
KIT CLEAN ENDO COMPLIANCE (KITS) ×2 IMPLANT
KIT PROCEDURE EDGE 180 (KITS) ×1 IMPLANT
KIT PROCEDURE EDGE 45 (KITS) IMPLANT
KIT PROCEDURE EDGE 90 (KITS) IMPLANT
KIT ROOM TURNOVER OR (KITS) ×2 IMPLANT
MARKER SKIN DUAL TIP RULER LAB (MISCELLANEOUS) ×2 IMPLANT
NDL SUPERTRX PREMARK BIOPSY (NEEDLE) IMPLANT
NEEDLE SUPERTRX PREMARK BIOPSY (NEEDLE) ×6 IMPLANT
NS IRRIG 1000ML POUR BTL (IV SOLUTION) ×2 IMPLANT
OIL SILICONE PENTAX (PARTS (SERVICE/REPAIRS)) ×2 IMPLANT
PAD ARMBOARD 7.5X6 YLW CONV (MISCELLANEOUS) ×4 IMPLANT
PATCHES PATIENT (LABEL) ×6 IMPLANT
SYR 20CC LL (SYRINGE) IMPLANT
SYR 20ML ECCENTRIC (SYRINGE) ×2 IMPLANT
SYR 3ML LL SCALE MARK (SYRINGE) ×2 IMPLANT
TOWEL GREEN STERILE (TOWEL DISPOSABLE) ×2 IMPLANT
TOWEL GREEN STERILE FF (TOWEL DISPOSABLE) ×2 IMPLANT
TRAP SPECIMEN MUCOUS 40CC (MISCELLANEOUS) ×2 IMPLANT
TUBE CONNECTING 20X1/4 (TUBING) ×2 IMPLANT
UNDERPAD 30X30 (UNDERPADS AND DIAPERS) ×2 IMPLANT
WATER STERILE IRR 1000ML POUR (IV SOLUTION) ×2 IMPLANT

## 2017-06-08 NOTE — Discharge Instructions (Addendum)
Flexible Bronchoscopy, Care After °This sheet gives you information about how to care for yourself after your procedure. Your health care provider may also give you more specific instructions. If you have problems or questions, contact your health care provider. °What can I expect after the procedure? °After the procedure, it is common to have the following symptoms for 24-48 hours: °· A cough that is worse than it was before the procedure. °· A low-grade fever. °· A sore throat or hoarse voice. °· Small streaks of blood in the mucus from your lungs (sputum), if tissue samples were removed (biopsy). ° °Follow these instructions at home: °Eating and drinking °· Do not eat or drink anything (including water) for 2 hours after your procedure, or until your numbing medicine (local anesthetic) has worn off. Having a numb throat increases your risk of burning yourself or choking. °· After your numbness is gone and your cough and gag reflexes have returned, you may start eating only soft foods and slowly drinking liquids. °· The day after the procedure, return to your normal diet. °Driving °· Do not drive for 24 hours if you were given a medicine to help you relax (sedative). °· Do not drive or use heavy machinery while taking prescription pain medicine. °General instructions °· Take over-the-counter and prescription medicines only as told by your health care provider. °· Return to your normal activities as told by your health care provider. Ask your health care provider what activities are safe for you. °· Do not use any products that contain nicotine or tobacco, such as cigarettes and e-cigarettes. If you need help quitting, ask your health care provider. °· Keep all follow-up visits as told by your health care provider. This is important, especially if you had a biopsy taken. °Get help right away if: °· You have shortness of breath that gets worse. °· You become light-headed or feel like you might faint. °· You have  chest pain. °· You cough up more than a small amount of blood. °· The amount of blood you cough up increases. °Summary °· Common symptoms in the 24-48 hours following a flexible bronchoscopy include cough, low-grade fever, sore throat or hoarse voice, and blood-streaked mucus from the lungs (if you had a biopsy). °· Do not eat or drink anything (including water) for 2 hours after your procedure, or until your local anesthetic has worn off. You can return to your normal diet the day after the procedure. °· Get help right away if you develop worsening shortness of breath, have chest pain, become light-headed, or cough up more than a small amount of blood. °This information is not intended to replace advice given to you by your health care provider. Make sure you discuss any questions you have with your health care provider. °Document Released: 11/04/2004 Document Revised: 05/05/2016 Document Reviewed: 05/05/2016 °Elsevier Interactive Patient Education © 2017 [employer] ° °

## 2017-06-08 NOTE — Anesthesia Preprocedure Evaluation (Addendum)
Anesthesia Evaluation  Patient identified by MRN, date of birth, ID band Patient awake    Reviewed: Allergy & Precautions, NPO status , Patient's Chart, lab work & pertinent test results  Airway Mallampati: I  TM Distance: >3 FB Neck ROM: Full    Dental  (+) Teeth Intact, Dental Advisory Given   Pulmonary asthma , former smoker,    breath sounds clear to auscultation       Cardiovascular hypertension, Pt. on medications + Peripheral Vascular Disease   Rhythm:Regular Rate:Normal     Neuro/Psych PSYCHIATRIC DISORDERS Depression    GI/Hepatic negative GI ROS, Neg liver ROS,   Endo/Other  negative endocrine ROS  Renal/GU negative Renal ROS  negative genitourinary   Musculoskeletal negative musculoskeletal ROS (+)   Abdominal Normal abdominal exam  (+)   Peds  Hematology negative hematology ROS (+)   Anesthesia Other Findings   Reproductive/Obstetrics                           Lab Results  Component Value Date   WBC 8.4 06/08/2017   HGB 15.3 (H) 06/08/2017   HCT 44.7 06/08/2017   MCV 90.3 06/08/2017   PLT 213 06/08/2017   Lab Results  Component Value Date   CREATININE 1.05 (H) 06/08/2017   BUN 15 06/08/2017   NA 140 06/08/2017   K 4.2 06/08/2017   CL 108 06/08/2017   CO2 20 (L) 06/08/2017   Lab Results  Component Value Date   INR 0.93 06/08/2017   INR 1.14 03/23/2015   EKG: normal sinus rhythm.  Anesthesia Physical Anesthesia Plan  ASA: II  Anesthesia Plan: General   Post-op Pain Management:    Induction: Intravenous  PONV Risk Score and Plan: 4 or greater and Ondansetron, Dexamethasone, Midazolam and Scopolamine patch - Pre-op  Airway Management Planned: Oral ETT  Additional Equipment: None  Intra-op Plan:   Post-operative Plan: Extubation in OR  Informed Consent: I have reviewed the patients History and Physical, chart, labs and discussed the procedure  including the risks, benefits and alternatives for the proposed anesthesia with the patient or authorized representative who has indicated his/her understanding and acceptance.   Dental advisory given  Plan Discussed with: CRNA  Anesthesia Plan Comments:         Anesthesia Quick Evaluation

## 2017-06-08 NOTE — Brief Op Note (Signed)
      [address]       [place] [address]             [phone]     06/08/2017  2:35 PM  PATIENT:  Shelby Becker  62 y.o. female  PRE-OPERATIVE DIAGNOSIS:  LUNG LESIONs  POST-OPERATIVE DIAGNOSIS:  LUNG LESIONs  PROCEDURE:  Procedure(s): VIDEO BRONCHOSCOPY WITH ENDOBRONCHIAL NAVIGATION (N/A) LUNG BIOPSY (N/A) right upper, right lower and left lower lobes   SURGEON:  Surgeon(s) and Role:    * Grace Isaac, MD - Primary  PHYSICIAN ASSISTANT:   ASSISTANTS: none   ANESTHESIA:   general  EBL:  2 mL   BLOOD ADMINISTERED:none  DRAINS: none   LOCAL MEDICATIONS USED:  LIDOCAINE   SPECIMEN:  Source of Specimen:  right lower and left lower and right upper lobe nodues  DISPOSITION OF SPECIMEN:  PATHOLOGY  COUNTS:  YES  DICTATION: .Dragon Dictation  PLAN OF CARE: Discharge to home after PACU  PATIENT DISPOSITION:  PACU - hemodynamically stable.   Delay start of Pharmacological VTE agent (>24hrs) due to surgical blood loss or risk of bleeding: yes

## 2017-06-08 NOTE — Transfer of Care (Signed)
Immediate Anesthesia Transfer of Care Note  Patient: Shelby Becker  Procedure(s) Performed: VIDEO BRONCHOSCOPY WITH ENDOBRONCHIAL NAVIGATION (N/A ) LUNG BIOPSY (N/A )  Patient Location: PACU  Anesthesia Type:General  Level of Consciousness: awake, alert , oriented and patient cooperative  Airway & Oxygen Therapy: Patient Spontanous Breathing and Patient connected to face mask oxygen  Post-op Assessment: Report given to RN, Post -op Vital signs reviewed and stable and Patient moving all extremities X 4  Post vital signs: Reviewed and stable  Last Vitals:  Vitals:   06/08/17 0817 06/08/17 1402  BP: (!) 170/82 123/75  Pulse: 81 96  Resp: 18 17  Temp: 36.5 C 36.6 C  SpO2: 99% 97%    Last Pain:  Vitals:   06/08/17 1402  TempSrc:   PainSc: 0-No pain      Patients Stated Pain Goal: 3 (36/43/83 7793)  Complications: No apparent anesthesia complications

## 2017-06-08 NOTE — Anesthesia Procedure Notes (Signed)
Procedure Name: Intubation Date/Time: 06/08/2017 10:48 AM Performed by: Colin Benton, CRNA Pre-anesthesia Checklist: Emergency Drugs available, Suction available, Patient identified and Patient being monitored Patient Re-evaluated:Patient Re-evaluated prior to induction Oxygen Delivery Method: Circle system utilized Preoxygenation: Pre-oxygenation with 100% oxygen Induction Type: IV induction Ventilation: Mask ventilation without difficulty Laryngoscope Size: Miller and 2 Tube type: Oral Tube size: 8.5 mm Number of attempts: 1 Airway Equipment and Method: Stylet Placement Confirmation: ETT inserted through vocal cords under direct vision,  positive ETCO2 and breath sounds checked- equal and bilateral Secured at: 22 cm Tube secured with: Tape Dental Injury: Teeth and Oropharynx as per pre-operative assessment

## 2017-06-08 NOTE — H&P (Signed)
[address]       [place] [address]             [phone]                    Irish E Walraven Glynn Medical Record #[id number] Date of Birth: 11/02/1955  Referring: No ref. provider found Primary Care: Hulan Fess, MD Primary Cardiologist: No primary care provider on file.  Chief Complaint:    Multiple lung nodules   History of Present Illness:    Shelby Becker 62 y.o. female was seen in  the office request of Dr. Hulan Fess.  Patient's current problem started in 6301 following complications of cholecystectomy.  7 days postop she developed a biloma and and was incidentally found to have multiple pulmonary lung nodules at that time.  Subsequently she has had serial CT scans of the chest November 2016, April 2017 June 2018 December 2018.  PET scan was done recently because of the interval growth of approximately 3 mm .  The patient has no previous history of malignancy.  Colonoscopy was done less than a year ago with a small adenomatous polyp being removed. Last week Colonoscopy was repeated with negative findings . Patient had a benign breast biopsy in the 1990s.   Patient has a remote smoking history smoking none more than half a pack a day from age 62 to age 62  Pulmonary function studies have been done  Current Activity/ Functional Status:  Patient is independent with mobility/ambulation, transfers, ADL's, IADL's.   Zubrod Score: At the time of surgery this patient's most appropriate activity status/level should be described as: '[x]'     0    Normal activity, no symptoms '[]'     1    Restricted in physical strenuous activity but ambulatory, able to do out light work '[]'     2    Ambulatory and capable of self care, unable to do work activities, up and about               >50 % of waking hours                              '[]'     3    Only limited self care, in bed greater than 50% of waking hours '[]'     4    Completely disabled, no self care, confined  to bed or chair '[]'     5    Moribund   Past Medical History:  Diagnosis Date  . Asthma    "Exercise induced or cough variant" - very rarely use inhaler  . Bile duct leak    bile drainage tube in place.  . Chronic cholecystitis with calculus s/p lap chole 03/12/2015 03/22/2015  . Depression    Tx'd in 10-Jul-1989 after a death of an infant  . Dyspareunia   . History of chronic cough    since gallbladder surgery-"fluid built up under right lung", "is improved"  . Hypertension   . Pneumonia    about 10-12 years ago  . Raynaud's disease    in feet    Past Surgical History:  Procedure Laterality Date  . BREAST SURGERY  1994   benign cyst removed right breast  . CESAREAN SECTION  1991  . CHOLECYSTECTOMY     8'16 laparoscopic- bile leak in abdomen, ERD visit -drain placed in to drain about 1  liter bile fluid  . COLONOSCOPY    . DILATION AND CURETTAGE OF UTERUS    . LAPAROSCOPIC CHOLECYSTECTOMY SINGLE SITE WITH INTRAOPERATIVE CHOLANGIOGRAM  03/12/2015   Dr Johney Maine    Family History  Problem Relation Age of Onset  . Thyroid disease Mother        hypothyroid  . Hypertension Father   . Thyroid disease Sister        Hashimotos  In addition patient has family history of ASD closure and a daughter at age 70, one sibling had a aortic valve replacement, patient's parents are both alive at age 54 without significant health problems  Social History   Socioeconomic History  . Marital status: Married  Tobacco Use  . Smoking status: Former Smoker    Last attempt to quit: 05/01/1984    Years since quitting: 33.0  . Smokeless tobacco: Never Used  Substance and Sexual Activity  . Alcohol use: No    Alcohol/week: 0.6 oz    Types: 1 Standard drinks or equivalent per week    Comment: once weekly to rare  . Drug use: No  . Sexual activity: Yes    Partners: Male    Birth control/protection: Post-menopausal    Social History   Tobacco Use  Smoking Status Former Smoker  . Last attempt to  quit: 05/01/1984  . Years since quitting: 33.1  Smokeless Tobacco Never Used    Social History   Substance and Sexual Activity  Alcohol Use No  . Alcohol/week: 0.6 oz  . Types: 1 Standard drinks or equivalent per week   Comment: occasionally 3-4 times a month     Allergies  Allergen Reactions  . Nsaids Other (See Comments)    Low EGFR  . Bee Venom Swelling    Local swelling at site Has epipen    Current Facility-Administered Medications  Medication Dose Route Frequency Provider Last Rate Last Dose  . dextrose 5 % with cefUROXime (ZINACEF) ADS Med           . lactated ringers infusion   Intravenous Continuous Effie Berkshire, MD        Pertinent items are noted in HPI.   Review of Systems:  Review of Systems  Constitutional: Positive for weight loss. Negative for chills, diaphoresis, fever and malaise/fatigue.  HENT: Negative.   Respiratory: Positive for cough. Negative for hemoptysis, sputum production, shortness of breath and wheezing.   Cardiovascular: Negative.   Gastrointestinal: Negative.   Genitourinary: Negative.   Musculoskeletal: Negative.   Skin: Negative.   Neurological: Negative.  Negative for weakness.  Endo/Heme/Allergies: Negative.   Psychiatric/Behavioral: Negative.     Immunizations: Flu up to date [ y ]; Pneumococcal up to date Blue.Reese  ];    Physical Exam: BP (!) 170/82   Pulse 81   Temp 97.7 F (36.5 C) (Oral)   Resp 18   Ht '5\' 7"'  (1.702 m)   Wt 195 lb (88.5 kg)   LMP 05/01/2004   SpO2 99%   BMI 30.54 kg/m   PHYSICAL EXAMINATION: General appearance: alert and cooperative Head: Normocephalic, without obvious abnormality, atraumatic Neck: no adenopathy, no carotid bruit, no JVD, supple, symmetrical, trachea midline and thyroid not enlarged, symmetric, no tenderness/mass/nodules Lymph nodes: Cervical, supraclavicular, and axillary nodes normal. Resp: clear to auscultation bilaterally Back: symmetric, no curvature. ROM normal. No CVA  tenderness. Cardio: regular rate and rhythm, S1, S2 normal, no murmur, click, rub or gallop GI: soft, non-tender; bowel sounds normal; no masses,  no organomegaly  Extremities: extremities normal, atraumatic, no cyanosis or edema and Homans sign is negative, no sign of DVT Neurologic: Grossly normal  Diagnostic Studies & Laboratory data:    Recent Radiology Findings:   Nm Pet Image Initial (pi) Skull Base To Thigh  Result Date: 05/08/2017 CLINICAL DATA:  Initial treatment strategy for pulmonary nodules. EXAM: NUCLEAR MEDICINE PET SKULL BASE TO THIGH TECHNIQUE: 9.5 mCi F-18 FDG was injected intravenously. Full-ring PET imaging was performed from the skull base to thigh after the radiotracer. CT data was obtained and used for attenuation correction and anatomic localization. FASTING BLOOD GLUCOSE:  Value: 103 mg/dl COMPARISON:  CT 04/17/2017 FINDINGS: NECK No hypermetabolic lymph nodes in the neck. CHEST Bilateral pulmonary nodules which have metabolic activity. For example RIGHT upper lobe nodule measuring 11 mm (image 67 series 4) with SUV max 4 3. Medial LEFT lower lobe nodule measuring 9.0 mm (image 84 series 4) SUV max 4.0 some. Small nodules measurable metabolic activity No hypermetabolic mediastinal lymph nodes. No hypermetabolic axillary supraclavicular nodes ABDOMEN/PELVIS Hypermetabolic lesion along the anterior margin of the liver measures 2.6 cm by 2.1 (image 134, series 2. With SUV max equal 15.2. This lesion is subcapsular with mild extend into the hepatic parenchyma. This is same site is large post cholecystectomy biloma requiring drainage on CT of 04/06/2015 No additional hypermetabolic lesions liver. LEFT adrenal gland has mild metabolic activity just above liver renal lesion. There is focal activity in the ascending colon associated with a mass like region bowel wall thickening measuring 2.5 cm ( image 13 series) with SUV max 7. Band of the bowel is normal. Uterus and ovaries appear  radiotracer SKELETON No focal hypermetabolic activity to suggest skeletal metastasis. IMPRESSION: 1. Hypermetabolic lesion along the anterior margin of the liver at site of prior biloma. Favor chronic infection / inflammation however cannot exclude a concomitant carcinoma although less favored. 2. Hypermetabolic round pulmonary nodules concerning for pulmonary metastasis. 3. No clear primary lesion identified. Masslike thickening in the ascending colon could represent colorectal carcinoma versus nondistended bowel. No hypermetabolic abdominopelvic adenopathy present. 4. Question utility ofliver biopsy given with history of biloma and potential chronic infection. RIGHT upper lobe nodule biopsy may be most meaningful (image 63 series 5). Colonoscopy to evaluate the ascending colon may also be warranted. These results will be called to the ordering clinician or representative by the Radiologist Assistant, and communication documented in the PACS or zVision Dashboard. Electronically Signed   By: Suzy Bouchard M.D.   On: 05/08/2017 11:11   I have independently reviewed the above radiology studies  and reviewed the findings with the patient.     CLINICAL DATA:  Follow-up lung nodules  EXAM: CT CHEST WITHOUT CONTRAST  TECHNIQUE: Multidetector CT imaging of the chest was performed following the standard protocol without IV contrast.  COMPARISON:  10/18/2016, 08/03/2015, 03/22/2015  FINDINGS: Cardiovascular: Limited evaluation without intravenous contrast. No aneurysmal dilatation. Normal heart size. No pericardial effusion.  Mediastinum/Nodes: Midline trachea. No thyroid mass. Increased right cardio phrenic lymph node measuring 9 mm, compared with 6 mm previously. Esophagus within normal limits.  Lungs/Pleura: No pleural effusion or pneumothorax. Mild tree-in-bud nodularity in the left upper lobe posteriorly, series 5, image number 24. 7 mm ground-glass density within the apex of the  right upper lobe, series 5, image number 22, slow progression over time. Multiple bilateral lung nodules are again visualized. Index right lower lobe lesion measures 12 mm, compared with 9 mm previously. 7 mm right middle lobe pulmonary nodule, series 5,  image number 78, compared with 4 mm previously. 9 mm pulmonary nodule medial right lung base, series 5 image number 101, previously 8 mm. Medial left lower lobe pulmonary nodule, series 5, image number 104 measures 12 mm, compared with 8 mm on more remote examinations and 11 mm on most recent prior. Focal scarring in the right middle lobe anteriorly is unchanged.  Upper Abdomen: Stable subcentimeter hypodensity in the left hepatic lobe. Surgical clips in the right upper quadrant.  Musculoskeletal: No acute or suspicious bone lesion. Degenerative changes. Asymmetric density in the right sub areolar and outer breast compare to contralateral left breast. No recent mammographic imaging.  IMPRESSION: 1. There are multiple bilateral pulmonary nodules, several of which have shown interval growth since the previous exams. Further evaluation with PET-CT is currently recommended. Also slight interval enlargement of a right cardio phrenic lymph nodes since the prior studies. 2. Mild posterior left upper lobe nodularity is suspect for respiratory infection. 3. Asymmetric density in the right subareolar and outer breasts. Recommend correlation with mammographic imaging. These results will be called to the ordering clinician or representative by the Radiologist Assistant, and communication documented in the PACS or zVision Dashboard.   Electronically Signed   By: Donavan Foil M.D.   On: 04/17/2017 19:17  I have independently reviewed the above radiology studies and compared them to previous scans in 2016 and 2017 and reviewed the findings with the patient.   Recent Lab Findings: Lab Results  Component Value Date   WBC 8.4  06/08/2017   HGB 15.3 (H) 06/08/2017   HCT 44.7 06/08/2017   PLT 213 06/08/2017   GLUCOSE 101 (H) 06/08/2017   ALT 25 06/08/2017   AST 23 06/08/2017   NA 140 06/08/2017   K 4.2 06/08/2017   CL 108 06/08/2017   CREATININE 1.05 (H) 06/08/2017   BUN 15 06/08/2017   CO2 20 (L) 06/08/2017   INR 0.93 06/08/2017   PFT's : FEV1 1.97 71%  DLCO 18.77 66%  Interpretation: The FEV1, FEV1/FVC ratio and FEF25-75% are reduced indicating airway obstruction. Lung volumes are within normal limits. Following administration of bronchodilators, there is no significant response. The reduced diffusing capacity indicates a mild loss of functional alveolar capillary surface. However, the diffusing capacity was not corrected for the patient's hemoglobin. Pulmonary Function Diagnosis: Mild Obstructive Airways Disease Mild Diffusion Defect   Assessment / Plan:   Patient with multiple small pulmonary nodules bilaterally present for at least 2 years first noted on incidental CT scan after complications from cholecystectomy, no previous history of malignancy.  PET scan suggests mild metabolic activity in the nodules.  I discussed with the patient various biopsy methods including CT-guided needle biopsy or navigation bronchoscopy or wedge resection for an excisional biopsy.   We will plan on February 8 proceeding with navigation bronchoscopy to the 2 lesions on the right and the lesion on the left.   The goals risks and alternatives of the planned surgical procedure Procedure(s): VIDEO BRONCHOSCOPY WITH ENDOBRONCHIAL NAVIGATION (N/A) LUNG BIOPSY (N/A)  have been discussed with the patient in detail. The risks of the procedure including death, infection, stroke, myocardial infarction, bleeding, blood transfusion, pneumothorax have all been discussed specifically.  I have quoted Haskel Khan a 1 % of perioperative mortality and a complication rate as high as 10 %. The patient's questions have been answered.Khaniya Tenaglia  Kirsch is willing  to proceed with the planned procedure.       Grace Isaac MD  [address] [place] [address] Office [phone]   Beeper [phone]  06/08/2017 10:34 AM

## 2017-06-09 NOTE — Anesthesia Postprocedure Evaluation (Signed)
Anesthesia Post Note  Patient: Shelby Becker  Procedure(s) Performed: VIDEO BRONCHOSCOPY WITH ENDOBRONCHIAL NAVIGATION (N/A ) LUNG BIOPSY (N/A )     Patient location during evaluation: PACU Anesthesia Type: General Level of consciousness: awake and alert Pain management: pain level controlled Vital Signs Assessment: post-procedure vital signs reviewed and stable Respiratory status: spontaneous breathing, nonlabored ventilation, respiratory function stable and patient connected to nasal cannula oxygen Cardiovascular status: blood pressure returned to baseline and stable Postop Assessment: no apparent nausea or vomiting Anesthetic complications: no    Last Vitals:  Vitals:   06/08/17 1615 06/08/17 1626  BP:  128/80  Pulse: 100 100  Resp: 14 14  Temp:    SpO2:  100%    Last Pain:  Vitals:   06/08/17 1626  TempSrc:   PainSc: 0-No pain                 Faithann Natal S

## 2017-06-10 ENCOUNTER — Encounter (HOSPITAL_COMMUNITY): Payer: Self-pay | Admitting: Cardiothoracic Surgery

## 2017-06-11 NOTE — Op Note (Signed)
NAME:  Shelby Becker, Shelby Becker NO.:  MEDICAL RECORD NO.:  [id number]  LOCATION:                                 FACILITY:  PHYSICIAN:  Lanelle Bal, MD         DATE OF BIRTH:  DATE OF PROCEDURE:  06/08/2017 DATE OF DISCHARGE:                              OPERATIVE REPORT   PREOPERATIVE DIAGNOSIS:  Multiple pulmonary nodules, right lower lobe, right upper lobe, left lower lobe.  PROCEDURE PERFORMED: 1. Video bronchoscopy, navigation bronchoscopy. 2. Three separate lesions with transbronchial biopsies and aspirations     with fluoroscopic assistance.  SURGEON:  Lanelle Bal, MD.  BRIEF HISTORY:  The patient is a 62 year old former light smoker, who has been followed for the past year and a half for multiple pulmonary nodules.  The patient had originally undergone cholecystectomy and postoperatively developed a bile leak and developed a biloma.  On CT evaluation of this, the patient was noted to have pulmonary nodule in the right lower lobe.  Further evaluation with serial CT scans had showed right lower lobe and left lower lobe nodules.  Initially, over time, these did not change, but the most recent CT scan in the fall of 2018 suggested that the right lower lobe nodule had increased 2 mm.  The right lower lobe and left lower lobe nodules were more solid approximately 1 cm in size.  The right upper lobe lesion was more ground- glass opacity and slightly smaller.  A PET scan was performed and there was mild hypermetabolic activity in the nodules.  There was no evidence of distant metastasis, though some area in the colon did have questionable hypermetabolic activity.  A repeat colonoscopy done the week before showed no colon lesion present.  With 3 separate nodules, the patient was told she had metastatic disease, but from unknown primary based on the PET scan.  She was referred to Thoracic Surgery. We reviewed our options and with 3 lesions in 2 separate  lungs, it was recommended to her that we first attempt ENB navigation bronchoscopy to try to obtain a tissue diagnosis.  Though with the relatively small size and position, she was aware that a negative biopsy would not necessarily mean a negative result.  She was agreeable with proceeding and signed informed consent.  DESCRIPTION OF PROCEDURE:  The patient underwent general endotracheal anesthesia without incident.  Single-lumen endotracheal tube was placed without difficulty.  Appropriate time-out was performed and then we proceeded with video bronchoscopy to the subsegmental level on both the left and right tracheobronchial tree without evidence of endobronchial lesions.  Preoperatively, navigation plans to 3 targets had been developed and loaded into the Super D navigation system.  We proceeded first navigating to target #1, which was the right lower lobe.  The system was registered correlating the CT scan with the navigation LG lead.  We then with a 180-degree catheter were able to navigate to the superior segment of the right lower lobe fairly easily with the LG probe in the vicinity of the nodule.  We then used fluoroscopy and loaded fluoroscopy images into the Super D system to better navigate to  the lesion.  Corrections were made and we had excellent alignment with the target.  With this in place, we then did needle brush aspirations, used a triple brush, and also took with small biopsy forceps lesions in the right lower lobe.  These quick smears were then sent to Pathology for examination as we moved to navigate to the left lower lesion.  New sets of biopsy forceps and brushings were used in a similar fashion to take multiple samples of brushings, aspirations, and biopsies of the left lower lobe mass.  This mass we were also very successful in navigating and having good alignment as we proceeded with the case.  We then moved to the ground-glass opacity in the right upper lobe  and again using new brushes and aspiration needles took further samples.  Again, in spite of the small size of the right upper lobe lesion, we were able to navigate to less than 0.5 cm of the lesion.  Additional biopsies were obtained. On pulmonary quick smears of all 3 lesions by Pathology, no definitive malignancy could be identified.  Permanent sections and biopsy specimens are pending.  Fluoroscopic exam of the chest showed no evidence of pneumothorax at the completion of the case.  The tracheobronchial tree was suctioned of all secretions.  The patient was then awakened and extubated in the operating room having tolerated the procedure without obvious complication.  There was minimal blood loss.  The patient was transferred to the recovery room for further postoperative care.     Lanelle Bal, MD     EG/MEDQ  D:  06/10/2017  T:  06/10/2017  Job:  [id number]

## 2017-06-13 ENCOUNTER — Encounter: Payer: BC Managed Care – PPO | Admitting: Cardiothoracic Surgery

## 2017-06-14 ENCOUNTER — Ambulatory Visit: Payer: BC Managed Care – PPO | Admitting: Cardiothoracic Surgery

## 2017-06-14 VITALS — BP 158/98 | HR 88 | Resp 20 | Ht 67.0 in | Wt 195.0 lb

## 2017-06-14 DIAGNOSIS — Z9889 Other specified postprocedural states: Secondary | ICD-10-CM

## 2017-06-14 NOTE — Progress Notes (Addendum)
[address]       [place] [address]             [phone]      Avaleen E Gasper [place] Medical Record #[id number] Date of Birth: 07-21-1955  Referring: Hulan Fess, MD Primary Care: Hulan Fess, MD Primary Cardiologist: No primary care provider on file.   Chief Complaint:   POST OP FOLLOW UP 06/08/2017 OPERATIVE REPORT PREOPERATIVE DIAGNOSIS:  Multiple pulmonary nodules, right lower lobe, right upper lobe, left lower lobe. PROCEDURE PERFORMED: 1. Video bronchoscopy, navigation bronchoscopy. 2. Three separate lesions with transbronchial biopsies and aspirations     with fluoroscopic assistance. SURGEON:  Lanelle Bal, MD.  Path: FINAL DIAGNOSIS Diagnosis Cytology of all three lesions negative except : Diagnosis TRANSBRONCHIAL NEEDLE ASPIRATION (B), NAVIGATION RIGHT LOWER LOBE, NEEDLE ASPIRATION (SPECIMEN 2 OF 9 COLLECTED 06/08/2017) NO MALIGNANT CELLS IDENTIFIED. VAGUE GRANULOMATOUS INFLAMMATION. Preliminary Diagnosis DX: NO MALIGNANT CELLS IDENTIFIED (NDK) JOSHUA KISH MD   1. Lung, biopsy, Right Lower Lobe - BENIGN LUNG PARENCHYMA, SCANT. - THERE IS NO EVIDENCE OF MALIGNANCY. 2. Lung, biopsy, Left Lower Lobe - BENIGN LUNG PARENCHYMA, SCANT. - THERE IS NO EVIDENCE OF MALIGNANCY. 3. Lung, biopsy, Right Upper Lobe - BENIGN LUNG PARENCHYMA. - THERE IS NO EVIDENCE OF MALIGNANCY. Enid Cutter MD    History of Present Illness:      Patient returns to the office today with follow-up CT of the chest .  She had navigation bronchoscopy to the right lower and left lower and right upper lobe lung masses in Jul 08, 2017 she tolerated procedure without complication.  On review of the pathology and discussion at the multidisciplinary thoracic oncology conference today, PET scan initially was interpreted as metastatic disease, it does not appear that that is the case.  All 3 areas biopsied showed no evidence of malignancy, although as I  discussed with the patient there is a definite rate of false negative biopsies with enb.  The right lower lobe mass cytology had evidence of a granulomatous disease.  On review of these nodules there really is no appreciable change since 07/08/2014.,  There is hypermetabolic activity noted.  The patient's had no change in her overall medical condition since last seen, she had delayed her scan several months because of the COVID infections.      Past Medical History:  Diagnosis Date  . Asthma    "Exercise induced or cough variant" - very rarely use inhaler  . Bile duct leak    bile drainage tube in place.  . Chronic cholecystitis with calculus s/p lap chole 03/12/2015 03/22/2015  . Depression    Tx'd in 1989-07-08 after a death of an infant  . Dyspareunia   . History of chronic cough    since gallbladder surgery-"fluid built up under right lung", "is improved"  . Hypertension   . Lung nodules 06/2017   followed by Dr.Khalis Hittle  . Pneumonia    about 10-12 years ago  . Raynaud's disease    in feet  . Tinnitus of right ear      Social History   Tobacco Use  Smoking Status Former Smoker  . Quit date: 05/01/1984  . Years since quitting: 34.5  Smokeless Tobacco Never Used    Social History   Substance and Sexual Activity  Alcohol Use No  . Alcohol/week: 1.0 standard drinks  . Types: 1 Standard drinks or equivalent per week   Comment: occasionally 3-4 times a month  Allergies  Allergen Reactions  . Nsaids Other (See Comments)    Low EGFR  . Bee Venom Swelling    Local swelling at site Has epipen    Current Outpatient Medications  Medication Sig Dispense Refill  . acetaminophen (TYLENOL) 325 MG tablet Take 650 mg by mouth every 6 (six) hours as needed for moderate pain (FOR PAIN.).     Marland Kitchen EPIPEN 2-PAK 0.3 MG/0.3ML SOAJ injection Inject 0.3 mg into the muscle once as needed (allergic reaction).     Marland Kitchen losartan (COZAAR) 100 MG tablet Take 50 mg by mouth daily.     Marland Kitchen PROAIR HFA  108 (90 BASE) MCG/ACT inhaler Inhale 2 puffs into the lungs daily as needed (before exercising).      No current facility-administered medications for this visit.        Physical Exam: BP (!) 158/98   Pulse 88   Resp 20   Ht _0  (1.702 m)   Wt 195 lb (88.5 kg)   LMP 05/01/2004 (Approximate)   SpO2 98% Comment: RA  BMI 30.54 kg/m   General appearance: alert and cooperative Head: Normocephalic, without obvious abnormality, atraumatic Neck: no adenopathy, no carotid bruit, no JVD, supple, symmetrical, trachea midline and thyroid not enlarged, symmetric, no tenderness/mass/nodules Lymph nodes: Cervical, supraclavicular, and axillary nodes normal. Resp: clear to auscultation bilaterally GI: soft, non-tender; bowel sounds normal; no masses,  no organomegaly Extremities: extremities normal, atraumatic, no cyanosis or edema and Homans sign is negative, no sign of DVT Neurologic: Grossly normal   Diagnostic Studies & Laboratory data:     Recent Radiology Findings:   Ct Chest Wo Contrast  Result Date: 11/14/2018 CLINICAL DATA:  Follow-up non solid lung nodules. EXAM: CT CHEST WITHOUT CONTRAST TECHNIQUE: Multidetector CT imaging of the chest was performed following the standard protocol without IV contrast. COMPARISON:  12/13/2017 FINDINGS: Cardiovascular: Normal heart size. No pericardial effusion. Mild aortic atherosclerosis. Mediastinum/Nodes: Normal appearance of the thyroid gland. The trachea appears patent and is midline. Normal appearance of the esophagus. No supraclavicular, axillary, mediastinal or hilar adenopathy. Lungs/Pleura: No pleural effusion identified. Multiple pulmonary nodules are again noted: -The solid nodule within the right lower lobe measures 1.3 cm, image 63/8. Similar to previous exam. -5 mm anterior right upper lobe lung nodule is also stable, image 52/8. -Unchanged ground-glass attenuating nodule in the right upper lobe measuring 6 mm, image 26/8. -The medial  right lower lobe lung nodule is stable measuring 9 mm, image 104/8. -Posteromedial left lower lobe lung nodule measures 1.4 cm, image 97/8. Previously 1.2 cm. -New nodular density within the left upper lobe measuring 4 mm is favored to represent an area of mucous plugging, image 34/8. Upper Abdomen: Mass overlying the anterior right lobe of liver measures 6.1 x 2.6 cm, image 63/6., image 120/2 and 63/6. On the previous exam this measured 5.1 x 1.9 cm. Musculoskeletal: No chest wall mass or suspicious bone lesions identified. IMPRESSION: 1. No significant interval change in the appearance of previously noted FDG avid bilateral solid pulmonary nodules. Also unchanged is a right upper lobe ground-glass. 2. There is a low-density mass overlying the anterior right lobe of liver which demonstrates continued interval growth. On PET-CT from 05/08/17 this was FDG avid and at that time was felt to likely reflect inflammatory changes secondary to prior biloma. Given the continued growth I am concerned that this represents tumor. Recommend further evaluation with contrast enhanced liver MRI. Electronically Signed   By: Kerby Moors M.D.   On:  11/14/2018 10:21      Recent Lab Findings: Lab Results  Component Value Date   WBC 8.4 06/08/2017   HGB 15.3 (H) 06/08/2017   HCT 44.7 06/08/2017   PLT 213 06/08/2017   GLUCOSE 101 (H) 06/08/2017   ALT 25 06/08/2017   AST 23 06/08/2017   NA 140 06/08/2017   K 4.2 06/08/2017   CL 108 06/08/2017   CREATININE 1.05 (H) 06/08/2017   BUN 15 06/08/2017   CO2 20 (L) 06/08/2017   INR 0.93 06/08/2017      Assessment / Plan:      On review of the patient's scans appears that his multiple pulmonary nodules have remained relatively stable since 2017, there is some note of slight enlargement of the area of the liver previously thought to be related to her biloma.  I discussed this with her and his radiology had recommended will obtain an MRI of the liver.  We will continue  to follow the pulmonary nodules with a follow-up CT scan of the chest in 8 months.    Grace Isaac MD      [address] [place] [address] Office [phone]   Beeper [phone]  11/14/2018 11:57 AM

## 2017-06-29 DIAGNOSIS — R918 Other nonspecific abnormal finding of lung field: Secondary | ICD-10-CM

## 2017-06-29 HISTORY — DX: Other nonspecific abnormal finding of lung field: R91.8

## 2017-10-22 ENCOUNTER — Other Ambulatory Visit: Payer: Self-pay

## 2017-10-22 ENCOUNTER — Other Ambulatory Visit (HOSPITAL_COMMUNITY)
Admission: RE | Admit: 2017-10-22 | Discharge: 2017-10-22 | Disposition: A | Discharge status: Home / Self Care | Payer: BC Managed Care – PPO | Source: Ambulatory Visit | Attending: Obstetrics and Gynecology | Admitting: Obstetrics and Gynecology

## 2017-10-22 ENCOUNTER — Ambulatory Visit (INDEPENDENT_AMBULATORY_CARE_PROVIDER_SITE_OTHER): Payer: BC Managed Care – PPO | Admitting: Obstetrics and Gynecology

## 2017-10-22 ENCOUNTER — Encounter: Payer: Self-pay | Admitting: Obstetrics and Gynecology

## 2017-10-22 VITALS — BP 122/78 | HR 88 | Resp 14 | Ht 67.0 in | Wt 188.0 lb

## 2017-10-22 DIAGNOSIS — Z01419 Encounter for gynecological examination (general) (routine) without abnormal findings: Secondary | ICD-10-CM | POA: Diagnosis present

## 2017-10-22 DIAGNOSIS — N763 Subacute and chronic vulvitis: Secondary | ICD-10-CM

## 2017-10-22 NOTE — Patient Instructions (Signed)

## 2017-10-22 NOTE — Progress Notes (Signed)
62 y.o. G75P3002 Married Caucasian female here for annual exam.    Stopped using vaginal estrogen tab for atrophy.  Needs this refilled but will not use until her next CT scan in August is normal.   No vaginal bleeding or spotting.   Good bladder control.  Getting over a virus.  Saw her PCP and will need to follow up.   Lung nodules and bx showed granulomas. Being followed by serial CTs.   Parents in their 72s and living at their home.  Appt with PCP next month.  Will do routine labs then.   PCP: Hulan Fess, MD  Patient's last menstrual period was 05/01/2004 (approximate).           Sexually active: No.  The current method of family planning is post menopausal status.    Exercising: Yes.    walking Smoker:  no  Health Maintenance: Pap: 10-09-13 Neg:neg HR HPV, 10-19-10 Neg History of abnormal Pap:  Yes, Hx colposcopy and cryotherapy to cervix 1986 MMG: 11-17-15 3D/Density B/Neg:BiRads1.  Done July 2018 - normal per patient, [facility].  Colonoscopy:09/14/15, Tubular Adenoma.  08/14/2017 - Dr. Cristina Gong at [facility].  BMD: 11-27-15 Result :Osteopenia  TDaP:  PCP Gardasil:   no HIV: probably while in hospital? Hep C:probably while in hospital? Screening Labs:  Hb today: PCP, Urine today: PCP   reports that she quit smoking about 33 years ago. She has never used smokeless tobacco. She reports that she does not drink alcohol or use drugs.  Past Medical History:  Diagnosis Date  . Asthma    "Exercise induced or cough variant" - very rarely use inhaler  . Bile duct leak    bile drainage tube in place.  . Chronic cholecystitis with calculus s/p lap chole 03/12/2015 03/22/2015  . Depression    Tx'd in August 14, 1989 after a death of an infant  . Dyspareunia   . History of chronic cough    since gallbladder surgery-"fluid built up under right lung", "is improved"  . Hypertension   . Lung nodules Aug 14, 2017   followed by Dr.Gerhardt  . Pneumonia    about 10-12 years ago  . Raynaud's disease     in feet    Past Surgical History:  Procedure Laterality Date  . BREAST SURGERY  1994   benign cyst removed right breast  . CESAREAN SECTION  1991  . CHOLECYSTECTOMY     8'16 laparoscopic- bile leak in abdomen, ERD visit -drain placed in to drain about 1 liter bile fluid  . COLONOSCOPY    . DILATION AND CURETTAGE OF UTERUS    . LAPAROSCOPIC CHOLECYSTECTOMY SINGLE SITE WITH INTRAOPERATIVE CHOLANGIOGRAM  03/12/2015   Dr Johney Maine  . LUNG BIOPSY N/A 06/08/2017   Procedure: LUNG BIOPSY;  Surgeon: Grace Isaac, MD;  Location: [facility];  Service: Thoracic;  Laterality: N/A;  . VIDEO BRONCHOSCOPY WITH ENDOBRONCHIAL NAVIGATION N/A 06/08/2017   Procedure: VIDEO BRONCHOSCOPY WITH ENDOBRONCHIAL NAVIGATION;  Surgeon: Grace Isaac, MD;  Location: [facility];  Service: Thoracic;  Laterality: N/A;    Current Outpatient Medications  Medication Sig Dispense Refill  . acetaminophen (TYLENOL) 325 MG tablet Take 650 mg by mouth every 6 (six) hours as needed for moderate pain (FOR PAIN.).     Marland Kitchen EPIPEN 2-PAK 0.3 MG/0.3ML SOAJ injection Inject 0.3 mg into the muscle once as needed (allergic reaction).     . fluticasone (FLONASE) 50 MCG/ACT nasal spray Place 1 spray into both nostrils daily.    Marland Kitchen losartan (COZAAR)  100 MG tablet Take 100 mg by mouth daily.    Marland Kitchen PROAIR HFA 108 (90 BASE) MCG/ACT inhaler Inhale 2 puffs into the lungs daily as needed (before exercising).      No current facility-administered medications for this visit.     Family History  Problem Relation Age of Onset  . Thyroid disease Mother        hypothyroid  . Hypertension Father   . Thyroid disease Sister        [place]    Review of Systems  Constitutional: Negative.   HENT: Positive for hearing loss.        Colds and virus recently  Eyes: Negative.   Respiratory:       Dx'd with benign granulomas of lungs 06/2017  Cardiovascular: Negative.   Gastrointestinal: Negative.   Endocrine: Negative.   Genitourinary: Negative.    Musculoskeletal: Negative.   Skin: Negative.   Allergic/Immunologic: Negative.   Neurological: Negative.   Hematological: Negative.   Psychiatric/Behavioral: Negative.     Exam:   BP 122/78 (BP Location: Right Arm, Patient Position: Sitting, Cuff Size: Large)   Pulse 88   Resp 14   Ht 5\' 7"  (1.702 m)   Wt 188 lb (85.3 kg)   LMP 05/01/2004 (Approximate)   BMI 29.44 kg/m     General appearance: alert, cooperative and appears stated age Head: Normocephalic, without obvious abnormality, atraumatic Neck: no adenopathy, supple, symmetrical, trachea midline and thyroid normal to inspection and palpation Lungs: clear to auscultation bilaterally Breasts: normal appearance, no masses or tenderness, No nipple retraction or dimpling, No nipple discharge or bleeding, No axillary or supraclavicular adenopathy Heart: regular rate and rhythm Abdomen: soft, non-tender; no masses, no organomegaly Extremities: extremities normal, atraumatic, no cyanosis or edema Skin: Skin color, texture, turgor normal. No rashes or lesions Lymph nodes: Cervical, supraclavicular, and axillary nodes normal. No abnormal inguinal nodes palpated Neurologic: Grossly normal  Pelvic: External genitalia:   Prominent erythema of the vulva.                Urethra:  normal appearing urethra with no masses, tenderness or lesions              Bartholins and Skenes: normal                 Vagina: normal appearing vagina with normal color and discharge, no lesions              Cervix: no lesions              Pap taken: Yes.   Bimanual Exam:  Uterus:  normal size, contour, position, consistency, mobility, non-tender              Adnexa: no mass, fullness, tenderness              Rectal exam: Yes.  .  Confirms.              Anus:  normal sphincter tone, no lesions  Chaperone was present for exam.  Assessment:   Well woman visit with normal exam. Remote hx abnormal pap.  Osteopenia.  Chronic vulvitis.    Plan: Mammogram screening recommended.  [facility]  Recommended self breast awareness. Pap and HR HPV as above. Guidelines for Calcium, Vitamin D, regular exercise program including cardiovascular and weight bearing exercise. Would like refill of Vagifem after her mammogram and CT are back.  I discussed potential effect of estrogen on  breast cancer.  Will get a copy of her mammogram from last year and her colonoscopy from this year.  BMD due end of this year or beginning next year.  PCP ordering.  Follow up annually and prn.   After visit summary provided.

## 2017-10-23 ENCOUNTER — Telehealth: Payer: Self-pay | Admitting: Obstetrics and Gynecology

## 2017-10-23 NOTE — Telephone Encounter (Signed)
I have conveyed benefits for recommended vulva biopsy. Patient wants to wait until October 2019 to have this done. Please advise on how to proceed with the order.  [employer]

## 2017-10-23 NOTE — Telephone Encounter (Signed)
Call placed to convey benefits. 

## 2017-10-24 LAB — CYTOLOGY - PAP
Diagnosis: NEGATIVE
HPV: NOT DETECTED

## 2017-10-24 NOTE — Telephone Encounter (Signed)
Please schedule for biopsy in October.  I am ok with waiting until then as her condition is chronic.

## 2017-10-26 ENCOUNTER — Telehealth: Payer: Self-pay | Admitting: Obstetrics and Gynecology

## 2017-10-26 NOTE — Telephone Encounter (Signed)
Spoke with patient, advised as seen below per Dr. Quincy Simmonds. Patient verbalizes understanding and is agreeable. Next AEX 11/06/18. Encounter closed.    Notes recorded by Nunzio Cobbs, MD on 10/24/2017 at 5:17 PM EDT Results to patient through My Chart. Pap recall - 02.  Hello Ms. Fenech,   Your pap and high risk HPV testing are normal and negative.   Please contact the office for any questions.   Have a good evening!  Josefa Half, MD

## 2017-10-26 NOTE — Telephone Encounter (Signed)
Patient is requesting results from 10/22/17. Stated that she had a message on MyChart that she had results, but forgot her password to access it.

## 2017-11-08 ENCOUNTER — Other Ambulatory Visit: Payer: Self-pay | Admitting: Cardiothoracic Surgery

## 2017-11-08 DIAGNOSIS — R911 Solitary pulmonary nodule: Secondary | ICD-10-CM

## 2017-11-08 NOTE — Progress Notes (Unsigned)
Ct

## 2017-12-13 ENCOUNTER — Ambulatory Visit
Admission: RE | Admit: 2017-12-13 | Discharge: 2017-12-13 | Disposition: A | Discharge status: Home / Self Care | Payer: BC Managed Care – PPO | Source: Ambulatory Visit | Attending: Cardiothoracic Surgery | Admitting: Cardiothoracic Surgery

## 2017-12-13 ENCOUNTER — Ambulatory Visit: Payer: BC Managed Care – PPO | Admitting: Cardiothoracic Surgery

## 2017-12-13 DIAGNOSIS — R911 Solitary pulmonary nodule: Secondary | ICD-10-CM

## 2017-12-18 ENCOUNTER — Ambulatory Visit (INDEPENDENT_AMBULATORY_CARE_PROVIDER_SITE_OTHER): Payer: BC Managed Care – PPO | Admitting: Cardiothoracic Surgery

## 2017-12-18 VITALS — BP 134/86 | HR 84 | Resp 20 | Ht 67.0 in

## 2017-12-18 DIAGNOSIS — R918 Other nonspecific abnormal finding of lung field: Secondary | ICD-10-CM | POA: Diagnosis not present

## 2017-12-18 NOTE — Progress Notes (Signed)
[address]       [place] [address]             [phone]      Tremeka E Mecum [place] Medical Record #[id number] Date of Birth: Oct 03, 1955  Referring: Hulan Fess, MD Primary Care: Hulan Fess, MD Primary Cardiologist: No primary care provider on file.   Chief Complaint:   POST OP FOLLOW UP 06/08/2017 OPERATIVE REPORT PREOPERATIVE DIAGNOSIS:  Multiple pulmonary nodules, right lower lobe, right upper lobe, left lower lobe. PROCEDURE PERFORMED: 1. Video bronchoscopy, navigation bronchoscopy. 2. Three separate lesions with transbronchial biopsies and aspirations     with fluoroscopic assistance. SURGEON:  Lanelle Bal, MD.  Path: FINAL DIAGNOSIS Diagnosis Cytology of all three lesions negative except : Diagnosis TRANSBRONCHIAL NEEDLE ASPIRATION (B), NAVIGATION RIGHT LOWER LOBE, NEEDLE ASPIRATION (SPECIMEN 2 OF 9 COLLECTED 06/08/2017) NO MALIGNANT CELLS IDENTIFIED. VAGUE GRANULOMATOUS INFLAMMATION. Preliminary Diagnosis DX: NO MALIGNANT CELLS IDENTIFIED (NDK) JOSHUA KISH MD   1. Lung, biopsy, Right Lower Lobe - BENIGN LUNG PARENCHYMA, SCANT. - THERE IS NO EVIDENCE OF MALIGNANCY. 2. Lung, biopsy, Left Lower Lobe - BENIGN LUNG PARENCHYMA, SCANT. - THERE IS NO EVIDENCE OF MALIGNANCY. 3. Lung, biopsy, Right Upper Lobe - BENIGN LUNG PARENCHYMA. - THERE IS NO EVIDENCE OF MALIGNANCY. Enid Cutter MD    History of Present Illness:      Patient returns to the office today with a six-month follow-up CT scan .  Previously she had navigation bronchoscopy to the right lower and left lower and right upper lobe lung masses.  She tolerated procedure without complication. the the PET scan initially was interpreted as metastatic disease, it does not appear that that is the case.  All 3 areas biopsied showed no evidence of malignancy, although as I discussed with the patient there is a definite rate of false negative biopsies with enb.  The right  lower lobe mass cytology had evidence of a granulomatous disease.    Since last seen she is noted ear infection problems with the right ear, she said no respiratory symptoms  Follow-up CT scan was done and reviewed with the patient and her husband    Past Medical History:  Diagnosis Date  . Asthma    "Exercise induced or cough variant" - very rarely use inhaler  . Bile duct leak    bile drainage tube in place.  . Chronic cholecystitis with calculus s/p lap chole 03/12/2015 03/22/2015  . Depression    Tx'd in 07-05-1989 after a death of an infant  . Dyspareunia   . History of chronic cough    since gallbladder surgery-"fluid built up under right lung", "is improved"  . Hypertension   . Lung nodules 06/2017   followed by Dr.Charles Niese  . Pneumonia    about 10-12 years ago  . Raynaud's disease    in feet     Social History   Tobacco Use  Smoking Status Former Smoker  . Last attempt to quit: 05/01/1984  . Years since quitting: 33.6  Smokeless Tobacco Never Used    Social History   Substance and Sexual Activity  Alcohol Use No  . Alcohol/week: 1.0 standard drinks  . Types: 1 Standard drinks or equivalent per week   Comment: occasionally 3-4 times a month     Allergies  Allergen Reactions  . Nsaids Other (See Comments)    Low EGFR  . Bee Venom Swelling    Local swelling at site Has epipen  Current Outpatient Medications  Medication Sig Dispense Refill  . acetaminophen (TYLENOL) 325 MG tablet Take 650 mg by mouth every 6 (six) hours as needed for moderate pain (FOR PAIN.).     Marland Kitchen EPIPEN 2-PAK 0.3 MG/0.3ML SOAJ injection Inject 0.3 mg into the muscle once as needed (allergic reaction).     . fluticasone (FLONASE) 50 MCG/ACT nasal spray Place 1 spray into both nostrils daily.    Marland Kitchen losartan (COZAAR) 100 MG tablet Take 50 mg by mouth daily.     Marland Kitchen PROAIR HFA 108 (90 BASE) MCG/ACT inhaler Inhale 2 puffs into the lungs daily as needed (before exercising).      No current  facility-administered medications for this visit.        Physical Exam: BP 134/86   Pulse 84   Resp 20   Ht '5\' 7"'$  (1.702 m)   LMP 05/01/2004 (Approximate)   SpO2 98% Comment: RA  BMI 29.44 kg/m  General appearance: alert and cooperative Head: Normocephalic, without obvious abnormality, atraumatic Neck: no adenopathy, no carotid bruit, no JVD, supple, symmetrical, trachea midline and thyroid not enlarged, symmetric, no tenderness/mass/nodules Lymph nodes: Cervical, supraclavicular, and axillary nodes normal. Resp: clear to auscultation bilaterally Back: symmetric, no curvature. ROM normal. No CVA tenderness. Cardio: regular rate and rhythm, S1, S2 normal, no murmur, click, rub or gallop GI: soft, non-tender; bowel sounds normal; no masses,  no organomegaly Extremities: extremities normal, atraumatic, no cyanosis or edema and Homans sign is negative, no sign of DVT Neurologic: Grossly normal    Diagnostic Studies & Laboratory data:     Recent Radiology Findings:  Ct Chest Wo Contrast  Result Date: 12/13/2017 CLINICAL DATA:  Follow-up hypermetabolic pulmonary nodules. No evidence of malignancy on bronchoscopic biopsy performed 06/08/2017. Former smoker. EXAM: CT CHEST WITHOUT CONTRAST TECHNIQUE: Multidetector CT imaging of the chest was performed following the standard protocol without IV contrast. COMPARISON:  06/08/2017 chest radiograph. 04/17/2017 chest CT. 05/08/2017 PET-CT. FINDINGS: Cardiovascular: Normal heart size. No significant pericardial effusion/thickening. Great vessels are normal in course and caliber. Mediastinum/Nodes: No discrete thyroid nodules. Unremarkable esophagus. No axillary adenopathy. Top-normal 0.8 cm right pericardiophrenic node (series 2/image 118) is stable. No pathologically enlarged mediastinal or discrete hilar nodes on this noncontrast scan. Lungs/Pleura: No pneumothorax. No pleural effusion. There are at least 5 scattered solid pulmonary nodules, all  stable since 04/17/2017 chest CT, largest 1.2 cm in the right lower lobe (series 8/image 71), 1.2 cm in the medial left lower lobe (series 8/image 102), 0.9 cm in the medial right lower lobe (series 8/image 106) and 0.6 cm in the medial right middle lobe (series 8/image 86). Stable 0.7 cm ground-glass apical right upper lobe nodule (series 8/image 33). No acute consolidative airspace disease, lung masses or new significant pulmonary nodules. Stable parenchymal band in medial right middle lobe. Upper abdomen: Cholecystectomy. Stable bandlike soft tissue attenuation in the anterior right perihepatic space measuring up to 1.8 cm thickness (series 2/image 130). Musculoskeletal: No aggressive appearing focal osseous lesions. Mild thoracic spondylosis. IMPRESSION: 1. Scattered solid pulmonary nodules are all stable since 04/17/2017 chest CT. No new or enlarging pulmonary nodules. Given hypermetabolism within these nodules on prior PET-CT, continued chest CT surveillance is warranted in 6-12 months. 2. Stable top-normal right pericardiophrenic node. No pathologically enlarged thoracic nodes. 3. Stable bandlike soft tissue attenuation in the anterior right perihepatic space, nonspecific but presumably scar tissue related to prior biloma in this location. Electronically Signed   By: Ilona Sorrel M.D.   On: 12/13/2017  12:33    I have independently reviewed the above radiology studies  and reviewed the findings with the patient.   Recent Lab Findings: Lab Results  Component Value Date   WBC 8.4 06/08/2017   HGB 15.3 (H) 06/08/2017   HCT 44.7 06/08/2017   PLT 213 06/08/2017   GLUCOSE 101 (H) 06/08/2017   ALT 25 06/08/2017   AST 23 06/08/2017   NA 140 06/08/2017   K 4.2 06/08/2017   CL 108 06/08/2017   CREATININE 1.05 (H) 06/08/2017   BUN 15 06/08/2017   CO2 20 (L) 06/08/2017   INR 0.93 06/08/2017      Assessment / Plan:      Previously I discussed with the patient various options at this point  including video-assisted thoracoscopy and biopsy versus continued radiographic follow-up.  Continued radiographic follow-up was what was recommended at the multidisciplinary thoracic oncology conference this was discussed with the patient.  The follow-up CT scan done several days ago appears stable compared to the scan in December 2018.  The patient again prefers to continue with follow-up CT scan which will obtain in 8 months.  If any of the nodules change in size or shape then we would consider biopsy with needle or VATS.   Grace Isaac MD      [address] [place] [address] Office [phone]   Beeper [phone]  12/18/2017 11:33 AM

## 2017-12-24 ENCOUNTER — Other Ambulatory Visit: Payer: Self-pay | Admitting: Family Medicine

## 2017-12-24 DIAGNOSIS — R7989 Other specified abnormal findings of blood chemistry: Secondary | ICD-10-CM

## 2017-12-24 DIAGNOSIS — R945 Abnormal results of liver function studies: Secondary | ICD-10-CM

## 2017-12-28 ENCOUNTER — Other Ambulatory Visit: Payer: Self-pay | Admitting: Otolaryngology

## 2017-12-28 DIAGNOSIS — H9202 Otalgia, left ear: Secondary | ICD-10-CM

## 2018-01-31 ENCOUNTER — Telehealth: Payer: Self-pay | Admitting: Obstetrics and Gynecology

## 2018-01-31 NOTE — Telephone Encounter (Signed)
Call placed to follow up and schedule a recommended procedure.

## 2018-02-04 ENCOUNTER — Ambulatory Visit
Admission: RE | Admit: 2018-02-04 | Discharge: 2018-02-04 | Disposition: A | Discharge status: Home / Self Care | Payer: BC Managed Care – PPO | Source: Ambulatory Visit | Attending: Family Medicine | Admitting: Family Medicine

## 2018-02-04 DIAGNOSIS — R7989 Other specified abnormal findings of blood chemistry: Secondary | ICD-10-CM

## 2018-02-04 DIAGNOSIS — R945 Abnormal results of liver function studies: Secondary | ICD-10-CM

## 2018-02-05 ENCOUNTER — Other Ambulatory Visit: Payer: BC Managed Care – PPO

## 2018-02-21 NOTE — Telephone Encounter (Signed)
Call placed to convey benefits. 

## 2018-03-05 ENCOUNTER — Telehealth: Payer: Self-pay | Admitting: Obstetrics and Gynecology

## 2018-03-05 NOTE — Telephone Encounter (Signed)
Spoke with patient regarding scheduling vulva biopsy. Patient declines the procedure. She does not not feel like it is necessary now. She is not having any issues right now.

## 2018-03-07 NOTE — Telephone Encounter (Signed)
Order DC per Dr. Quincy Simmonds.  Cc [employer] . Encounter closed.

## 2018-03-07 NOTE — Telephone Encounter (Signed)
Routing message to Dr. Quincy Simmonds as patient is declining to schedule vulvar biopsy.  Please advise if needs letter or okay to DC order as patient declines symptoms.

## 2018-03-07 NOTE — Telephone Encounter (Signed)
Ok to cancel vulvar biopsy order.

## 2018-06-27 ENCOUNTER — Other Ambulatory Visit: Payer: Self-pay | Admitting: *Deleted

## 2018-06-27 DIAGNOSIS — R918 Other nonspecific abnormal finding of lung field: Secondary | ICD-10-CM

## 2018-08-29 ENCOUNTER — Other Ambulatory Visit: Payer: BC Managed Care – PPO

## 2018-08-29 ENCOUNTER — Ambulatory Visit: Payer: BC Managed Care – PPO | Admitting: Cardiothoracic Surgery

## 2018-09-19 ENCOUNTER — Other Ambulatory Visit: Payer: BC Managed Care – PPO

## 2018-09-19 ENCOUNTER — Ambulatory Visit: Payer: BC Managed Care – PPO | Admitting: Cardiothoracic Surgery

## 2018-11-04 ENCOUNTER — Other Ambulatory Visit: Payer: Self-pay

## 2018-11-05 ENCOUNTER — Other Ambulatory Visit: Payer: Self-pay | Admitting: Otolaryngology

## 2018-11-05 DIAGNOSIS — H93A9 Pulsatile tinnitus, unspecified ear: Secondary | ICD-10-CM

## 2018-11-06 ENCOUNTER — Encounter: Payer: Self-pay | Admitting: Obstetrics and Gynecology

## 2018-11-06 ENCOUNTER — Other Ambulatory Visit: Payer: Self-pay

## 2018-11-06 ENCOUNTER — Ambulatory Visit: Payer: BC Managed Care – PPO | Admitting: Obstetrics and Gynecology

## 2018-11-06 ENCOUNTER — Telehealth: Payer: Self-pay | Admitting: Obstetrics and Gynecology

## 2018-11-06 VITALS — BP 142/70 | HR 68 | Temp 97.2°F | Resp 14 | Ht 67.0 in | Wt 188.0 lb

## 2018-11-06 DIAGNOSIS — Z01419 Encounter for gynecological examination (general) (routine) without abnormal findings: Secondary | ICD-10-CM

## 2018-11-06 DIAGNOSIS — N9089 Other specified noninflammatory disorders of vulva and perineum: Secondary | ICD-10-CM | POA: Diagnosis not present

## 2018-11-06 NOTE — Telephone Encounter (Signed)
Call placed to convey benefits for vulva biopsy. 

## 2018-11-06 NOTE — Progress Notes (Signed)
63 y.o. [id number] Married Caucasian female here for annual exam.    She has chronic vulvitis and did research about lichen sclerosus.  She does not think she has this.  She has made some dietary changes and her vulva feels better.  She has reduced her use of acidic or spicy foods.   She has a hx of low vit D.   She is waiting for clearance from Dr. Servando Snare for her lung nodules, and she will not use estrogen until this is completed.  Parents are elderly and have health problems.  Mother had C diff and did a fecal transfer and she later broke her hip.  Daughter had to cancel her large wedding.   PCP: Hulan Fess, MD    Patient's last menstrual period was 05/01/2004 (approximate).           Sexually active: No.  The current method of family planning is post menopausal status.    Exercising: Yes.    walking Smoker:  no  Health Maintenance: Pap:  10/22/17 Neg:Neg HR HPV History of abnormal Pap:  Yes, Hx colposcopy and cryotherapy to cervix 1986 MMG:  11/26/17 BIRADS 1 negative/density b Colonoscopy: 06/04/17 f/u 10 years   BMD:   11/27/15  Result  Osteopenia of hips. TDaP:  PCP HIV and Hep C: possibly done in the past Screening Labs:  PCP   reports that she quit smoking about 34 years ago. She has never used smokeless tobacco. She reports that she does not drink alcohol or use drugs.  Past Medical History:  Diagnosis Date  . Asthma    "Exercise induced or cough variant" - very rarely use inhaler  . Bile duct leak    bile drainage tube in place.  . Chronic cholecystitis with calculus s/p lap chole 03/12/2015 03/22/2015  . Depression    Tx'd in 08-17-1989 after a death of an infant  . Dyspareunia   . History of chronic cough    since gallbladder surgery-"fluid built up under right lung", "is improved"  . Hypertension   . Lung nodules 17-Aug-2017   followed by Dr.Gerhardt  . Pneumonia    about 10-12 years ago  . Raynaud's disease    in feet  . Tinnitus of right ear     Past  Surgical History:  Procedure Laterality Date  . BREAST SURGERY  1994   benign cyst removed right breast  . CESAREAN SECTION  1991  . CHOLECYSTECTOMY     8'16 laparoscopic- bile leak in abdomen, ERD visit -drain placed in to drain about 1 liter bile fluid  . COLONOSCOPY    . DILATION AND CURETTAGE OF UTERUS    . LAPAROSCOPIC CHOLECYSTECTOMY SINGLE SITE WITH INTRAOPERATIVE CHOLANGIOGRAM  03/12/2015   Dr Johney Maine  . LUNG BIOPSY N/A 06/08/2017   Procedure: LUNG BIOPSY;  Surgeon: Grace Isaac, MD;  Location: [facility];  Service: Thoracic;  Laterality: N/A;  . VIDEO BRONCHOSCOPY WITH ENDOBRONCHIAL NAVIGATION N/A 06/08/2017   Procedure: VIDEO BRONCHOSCOPY WITH ENDOBRONCHIAL NAVIGATION;  Surgeon: Grace Isaac, MD;  Location: [facility];  Service: Thoracic;  Laterality: N/A;    Current Outpatient Medications  Medication Sig Dispense Refill  . acetaminophen (TYLENOL) 325 MG tablet Take 650 mg by mouth every 6 (six) hours as needed for moderate pain (FOR PAIN.).     Marland Kitchen EPIPEN 2-PAK 0.3 MG/0.3ML SOAJ injection Inject 0.3 mg into the muscle once as needed (allergic reaction).     Marland Kitchen losartan (COZAAR) 100 MG tablet Take 50 mg  by mouth daily.     Marland Kitchen PROAIR HFA 108 (90 BASE) MCG/ACT inhaler Inhale 2 puffs into the lungs daily as needed (before exercising).      No current facility-administered medications for this visit.     Family History  Problem Relation Age of Onset  . Thyroid disease Mother        hypothyroid  . Hypertension Father   . Thyroid disease Sister        Hash[place]otos    Review of Systems  Constitutional: Negative.   HENT: Negative.   Eyes: Negative.   Respiratory: Negative.   Cardiovascular: Negative.   Gastrointestinal: Negative.   Endocrine: Negative.   Genitourinary: Negative.   Musculoskeletal: Negative.   Skin: Negative.   Allergic/Immunologic: Negative.   Neurological: Negative.   Hematological: Negative.   Psychiatric/Behavioral: Negative.     Exam:   BP (!) 142/70  (BP Location: Left Arm, Patient Position: Sitting, Cuff Size: Large)   Pulse 68   Temp (!) 97.2 F (36.2 C) (Temporal)   Resp 14   Ht 5\' 7"  (1.702 m)   Wt 188 lb (85.3 kg)   LMP 05/01/2004 (Approximate)   BMI 29.44 kg/m     General appearance: alert, cooperative and appears stated age Head: normocephalic, without obvious abnormality, atraumatic Neck: no adenopathy, supple, symmetrical, trachea midline and thyroid normal to inspection and palpation Lungs: clear to auscultation bilaterally Breasts: normal appearance, no masses or tenderness, No nipple retraction or dimpling, No nipple discharge or bleeding, No axillary adenopathy Heart: regular rate and rhythm Abdomen: soft, non-tender; no masses, no organomegaly Extremities: extremities normal, atraumatic, no cyanosis or edema Skin: skin color, texture, turgor normal. No rashes or lesions Lymph nodes: cervical, supraclavicular, and axillary nodes normal. Neurologic: grossly normal  Pelvic: External genitalia: erythema - beefy red - of the vestibule.              No abnormal inguinal nodes palpated.              Urethra:  normal appearing urethra with no masses, tenderness or lesions              Bartholins and Skenes: normal                 Vagina: normal appearing vagina with normal color and discharge, no lesions              Cervix: no lesions              Pap taken: No. Bimanual Exam:  Uterus:  normal size, contour, position, consistency, mobility, non-tender              Adnexa: no mass, fullness, tenderness              Rectal exam: Yes.  .  Confirms.              Anus:  normal sphincter tone, no lesions  Chaperone was present for exam.  Assessment:   Well woman visit with normal exam. Remote hx abnormal pap.  Osteopenia.  Chronic vulvitis.  Lichen planus? Pulmonary nodules.  Waiting for clearance for returning to use of vaginal estrogen tablets.   Plan: Mammogram screening discussed. Self breast awareness  reviewed. Pap and HR HPV as above. Guidelines for Calcium, Vitamin D, regular exercise program including cardiovascular and weight bearing exercise. BMD discussed.  She will ask her PCP to order this.  We talked about vit D supplementation.  We reviewed lichen planus in Up  To Date.  She will return for a vulvar biopsy.  Follow up annually and prn.   After visit summary provided.

## 2018-11-07 NOTE — Telephone Encounter (Signed)
Returned call to patient. Reviewed benefits for a vulvar biopsy. Patient understood information presented and is ready to proceed with scheduling. Patient is scheduled 11/19/2018 with Dr Quincy Simmonds. Patient is aware of the appointment date, arrival time and cancellation policy. No further questions. Will close encounter

## 2018-11-07 NOTE — Telephone Encounter (Signed)
Patient returning call for benefits.

## 2018-11-13 ENCOUNTER — Other Ambulatory Visit: Payer: Self-pay

## 2018-11-14 ENCOUNTER — Ambulatory Visit: Payer: BC Managed Care – PPO | Admitting: Cardiothoracic Surgery

## 2018-11-14 ENCOUNTER — Encounter: Payer: Self-pay | Admitting: Cardiothoracic Surgery

## 2018-11-14 ENCOUNTER — Other Ambulatory Visit: Payer: Self-pay | Admitting: *Deleted

## 2018-11-14 ENCOUNTER — Ambulatory Visit
Admission: RE | Admit: 2018-11-14 | Discharge: 2018-11-14 | Disposition: A | Discharge status: Home / Self Care | Payer: BC Managed Care – PPO | Source: Ambulatory Visit | Attending: Cardiothoracic Surgery | Admitting: Cardiothoracic Surgery

## 2018-11-14 VITALS — BP 139/89 | HR 97 | Temp 97.5°F | Resp 18 | Ht 67.0 in | Wt 190.6 lb

## 2018-11-14 DIAGNOSIS — R918 Other nonspecific abnormal finding of lung field: Secondary | ICD-10-CM

## 2018-11-14 DIAGNOSIS — R1907 Generalized intra-abdominal and pelvic swelling, mass and lump: Secondary | ICD-10-CM

## 2018-11-14 NOTE — Progress Notes (Signed)
[address]       [place] [address]             [phone]      Shelby Becker [facility] Medical Record #[id number] Date of Birth: 01/02/1956  Referring: Hulan Fess, MD Primary Care: Hulan Fess, MD Primary Cardiologist: No primary care provider on file.   Chief Complaint:   POST OP FOLLOW UP 06/08/2017 OPERATIVE REPORT PREOPERATIVE DIAGNOSIS:  Multiple pulmonary nodules, right lower lobe, right upper lobe, left lower lobe. PROCEDURE PERFORMED: 1. Video bronchoscopy, navigation bronchoscopy. 2. Three separate lesions with transbronchial biopsies and aspirations     with fluoroscopic assistance. SURGEON:  Lanelle Bal, MD.  Path: FINAL DIAGNOSIS Diagnosis Cytology of all three lesions negative except : Diagnosis TRANSBRONCHIAL NEEDLE ASPIRATION (B), NAVIGATION RIGHT LOWER LOBE, NEEDLE ASPIRATION (SPECIMEN 2 OF 9 COLLECTED 06/08/2017) NO MALIGNANT CELLS IDENTIFIED. VAGUE GRANULOMATOUS INFLAMMATION. Preliminary Diagnosis DX: NO MALIGNANT CELLS IDENTIFIED (NDK) JOSHUA KISH MD   1. Lung, biopsy, Right Lower Lobe - BENIGN LUNG PARENCHYMA, SCANT. - THERE IS NO EVIDENCE OF MALIGNANCY. 2. Lung, biopsy, Left Lower Lobe - BENIGN LUNG PARENCHYMA, SCANT. - THERE IS NO EVIDENCE OF MALIGNANCY. 3. Lung, biopsy, Right Upper Lobe - BENIGN LUNG PARENCHYMA. - THERE IS NO EVIDENCE OF MALIGNANCY. Enid Cutter MD    History of Present Illness:      Patient returns to the office today with a six-month follow-up CT scan .  Previously she had navigation bronchoscopy to the right lower and left lower and right upper lobe lung masses.  She tolerated procedure without complication. the the PET scan initially was interpreted as metastatic disease, it does not appear that that is the case.  All 3 areas biopsied showed no evidence of malignancy, although as I discussed with the patient there is a definite rate of false negative biopsies with enb.  The right  lower lobe mass cytology had evidence of a granulomatous disease.         Past Medical History:  Diagnosis Date   Asthma    "Exercise induced or cough variant" - very rarely use inhaler   Bile duct leak    bile drainage tube in place.   Chronic cholecystitis with calculus s/p lap chole 03/12/2015 03/22/2015   Depression    Tx'd in July 10, 1989 after a death of an infant   Dyspareunia    History of chronic cough    since gallbladder surgery-"fluid built up under right lung", "is improved"   Hypertension    Lung nodules 06/2017   followed by Dr.Lonie Newsham   Pneumonia    about 10-12 years ago   Raynaud's disease    in feet   Tinnitus of right ear      Social History   Tobacco Use  Smoking Status Former Smoker   Quit date: 05/01/1984   Years since quitting: 34.5  Smokeless Tobacco Never Used    Social History   Substance and Sexual Activity  Alcohol Use No   Alcohol/week: 1.0 standard drinks   Types: 1 Standard drinks or equivalent per week   Comment: occasionally 3-4 times a month     Allergies  Allergen Reactions   Nsaids Other (See Comments)    Low EGFR   Bee Venom Swelling    Local swelling at site Has epipen    Current Outpatient Medications  Medication Sig Dispense Refill   acetaminophen (TYLENOL) 325 MG tablet Take 650 mg by mouth every 6 (six) hours  as needed for moderate pain (FOR PAIN.).      EPIPEN 2-PAK 0.3 MG/0.3ML SOAJ injection Inject 0.3 mg into the muscle once as needed (allergic reaction).      losartan (COZAAR) 100 MG tablet Take 50 mg by mouth daily.      PROAIR HFA 108 (90 BASE) MCG/ACT inhaler Inhale 2 puffs into the lungs daily as needed (before exercising).      No current facility-administered medications for this visit.        Physical Exam: BP 139/89 (BP Location: Left Arm, Patient Position: Sitting, Cuff Size: Large)    Pulse 97    Temp (!) 97.5 F (36.4 C)    Resp 18    Ht '5\' 7"'  (1.702 m)    Wt 190 lb 9.6 oz  (86.5 kg)    LMP 05/01/2004 (Approximate)    SpO2 97% Comment: RA   BMI 29.85 kg/m  General appearance: alert and cooperative Neck: no adenopathy, no carotid bruit, no JVD, supple, symmetrical, trachea midline and thyroid not enlarged, symmetric, no tenderness/mass/nodules Lymph nodes: Cervical, supraclavicular, and axillary nodes normal. Resp: clear to auscultation bilaterally Cardio: regular rate and rhythm, S1, S2 normal, no murmur, click, rub or gallop Extremities: extremities normal, atraumatic, no cyanosis or edema Neurologic: Grossly normal  Diagnostic Studies & Laboratory data:     Recent Radiology Findings:  Ct Chest Wo Contrast  Result Date: 11/14/2018 CLINICAL DATA:  Follow-up non solid lung nodules. EXAM: CT CHEST WITHOUT CONTRAST TECHNIQUE: Multidetector CT imaging of the chest was performed following the standard protocol without IV contrast. COMPARISON:  12/13/2017 FINDINGS: Cardiovascular: Normal heart size. No pericardial effusion. Mild aortic atherosclerosis. Mediastinum/Nodes: Normal appearance of the thyroid gland. The trachea appears patent and is midline. Normal appearance of the esophagus. No supraclavicular, axillary, mediastinal or hilar adenopathy. Lungs/Pleura: No pleural effusion identified. Multiple pulmonary nodules are again noted: -The solid nodule within the right lower lobe measures 1.3 cm, image 63/8. Similar to previous exam. -5 mm anterior right upper lobe lung nodule is also stable, image 52/8. -Unchanged ground-glass attenuating nodule in the right upper lobe measuring 6 mm, image 26/8. -The medial right lower lobe lung nodule is stable measuring 9 mm, image 104/8. -Posteromedial left lower lobe lung nodule measures 1.4 cm, image 97/8. Previously 1.2 cm. -New nodular density within the left upper lobe measuring 4 mm is favored to represent an area of mucous plugging, image 34/8. Upper Abdomen: Mass overlying the anterior right lobe of liver measures 6.1 x 2.6 cm,  image 63/6., image 120/2 and 63/6. On the previous exam this measured 5.1 x 1.9 cm. Musculoskeletal: No chest wall mass or suspicious bone lesions identified. IMPRESSION: 1. No significant interval change in the appearance of previously noted FDG avid bilateral solid pulmonary nodules. Also unchanged is a right upper lobe ground-glass. 2. There is a low-density mass overlying the anterior right lobe of liver which demonstrates continued interval growth. On PET-CT from 05/08/17 this was FDG avid and at that time was felt to likely reflect inflammatory changes secondary to prior biloma. Given the continued growth I am concerned that this represents tumor. Recommend further evaluation with contrast enhanced liver MRI. Electronically Signed   By: Kerby Moors M.D.   On: 11/14/2018 10:21  I have independently reviewed the above radiology studies  and reviewed the findings with the patient.     Ct Chest Wo Contrast  Result Date: 12/13/2017 CLINICAL DATA:  Follow-up hypermetabolic pulmonary nodules. No evidence of malignancy on bronchoscopic  biopsy performed 06/08/2017. Former smoker. EXAM: CT CHEST WITHOUT CONTRAST TECHNIQUE: Multidetector CT imaging of the chest was performed following the standard protocol without IV contrast. COMPARISON:  06/08/2017 chest radiograph. 04/17/2017 chest CT. 05/08/2017 PET-CT. FINDINGS: Cardiovascular: Normal heart size. No significant pericardial effusion/thickening. Great vessels are normal in course and caliber. Mediastinum/Nodes: No discrete thyroid nodules. Unremarkable esophagus. No axillary adenopathy. Top-normal 0.8 cm right pericardiophrenic node (series 2/image 118) is stable. No pathologically enlarged mediastinal or discrete hilar nodes on this noncontrast scan. Lungs/Pleura: No pneumothorax. No pleural effusion. There are at least 5 scattered solid pulmonary nodules, all stable since 04/17/2017 chest CT, largest 1.2 cm in the right lower lobe (series 8/image 71), 1.2  cm in the medial left lower lobe (series 8/image 102), 0.9 cm in the medial right lower lobe (series 8/image 106) and 0.6 cm in the medial right middle lobe (series 8/image 86). Stable 0.7 cm ground-glass apical right upper lobe nodule (series 8/image 33). No acute consolidative airspace disease, lung masses or new significant pulmonary nodules. Stable parenchymal band in medial right middle lobe. Upper abdomen: Cholecystectomy. Stable bandlike soft tissue attenuation in the anterior right perihepatic space measuring up to 1.8 cm thickness (series 2/image 130). Musculoskeletal: No aggressive appearing focal osseous lesions. Mild thoracic spondylosis. IMPRESSION: 1. Scattered solid pulmonary nodules are all stable since 04/17/2017 chest CT. No new or enlarging pulmonary nodules. Given hypermetabolism within these nodules on prior PET-CT, continued chest CT surveillance is warranted in 6-12 months. 2. Stable top-normal right pericardiophrenic node. No pathologically enlarged thoracic nodes. 3. Stable bandlike soft tissue attenuation in the anterior right perihepatic space, nonspecific but presumably scar tissue related to prior biloma in this location. Electronically Signed   By: Ilona Sorrel M.D.   On: 12/13/2017 12:33    I have independently reviewed the above radiology studies  and reviewed the findings with the patient.   Recent Lab Findings: Lab Results  Component Value Date   WBC 8.4 06/08/2017   HGB 15.3 (H) 06/08/2017   HCT 44.7 06/08/2017   PLT 213 06/08/2017   GLUCOSE 101 (H) 06/08/2017   ALT 25 06/08/2017   AST 23 06/08/2017   NA 140 06/08/2017   K 4.2 06/08/2017   CL 108 06/08/2017   CREATININE 1.05 (H) 06/08/2017   BUN 15 06/08/2017   CO2 20 (L) 06/08/2017   INR 0.93 06/08/2017      Assessment / Plan:   #1 no significant interval change in appearance of bilateral pulmonary nodules or right upper lobe groundglass area  #2 low density area over the right lobe of liver shows some  interval growth, this was previously thought to be related to her previous biloma-because of change we will obtain a MRI of the liver  I will plan to see her back in 8 months with a CT of the chest or sooner if there is any abnormality on MRI of the liver.   Grace Isaac MD      [address] [place] [address] Office [phone]   Beeper [phone]  11/14/2018 11:20 AM

## 2018-11-15 ENCOUNTER — Other Ambulatory Visit: Payer: Self-pay

## 2018-11-18 ENCOUNTER — Telehealth: Payer: Self-pay

## 2018-11-18 NOTE — Telephone Encounter (Signed)
Patient contacted the office requesting her contrast on her scheduled MRI to be given with Eovist instead of the usual contrast.  She stated that the contrast normally given causes "ototoxicity" which she stated she has had some recent hearing loss and wanted to avoid current contrast.  Notified [facility], spoke with Mardene Celeste whom spoke with Crystal and contrast changed to one patient requested.

## 2018-11-19 ENCOUNTER — Encounter: Payer: Self-pay | Admitting: Obstetrics and Gynecology

## 2018-11-19 ENCOUNTER — Ambulatory Visit: Payer: BC Managed Care – PPO | Admitting: Obstetrics and Gynecology

## 2018-11-19 ENCOUNTER — Other Ambulatory Visit: Payer: Self-pay

## 2018-11-19 VITALS — BP 100/66 | HR 76 | Temp 97.2°F | Resp 12 | Ht 67.0 in | Wt 191.0 lb

## 2018-11-19 DIAGNOSIS — N9089 Other specified noninflammatory disorders of vulva and perineum: Secondary | ICD-10-CM

## 2018-11-19 DIAGNOSIS — N763 Subacute and chronic vulvitis: Secondary | ICD-10-CM | POA: Diagnosis not present

## 2018-11-19 NOTE — Patient Instructions (Signed)
Vulva Biopsy, Care After This sheet gives you information about how to care for yourself after your procedure. Your health care provider may also give you more specific instructions. If you have problems or questions, contact your health care provider. What can I expect after the procedure? After the procedure, it is common to have:  Slight bleeding from the biopsy site.  Discomfort at the biopsy site. Follow these instructions at home: Biopsy site care   Follow instructions from your health care provider about how to take care of your biopsy site. Make sure you: ? Clean the area using water and mild soap twice a day or as told by your health care provider. Gently pat the area dry. ? If you were prescribed an antibiotic ointment, apply it as told by your health care provider. Do not stop using the antibiotic even if your condition improves. ? Take a warm water bath (sitz bath) as needed to help with pain and discomfort. A sitz bath is taken while you are sitting down. The water should only come up to your hips and should cover your buttocks. ? Leave stitches (sutures), skin glue, or adhesive strips in place. These skin closures may need to stay in place for 2 weeks or longer. If adhesive strip edges start to loosen and curl up, you may trim the loose edges. Do not remove adhesive strips completely unless your health care provider tells you to do that.  Check your biopsy site every day for signs of infection. Check for: ? More redness, swelling, or pain. ? More fluid or blood. ? Warmth. ? Pus or a bad smell.  Do not rub the biopsy area after urinating. Gently pat the area dry or use a bottle filled with warm water (peri-bottle) to clean the area. Gently wipe from front to back. Lifestyle  Wear loose, cotton underwear. Do not wear tight pants.  Do not use a tampon, douche, or put anything inside your vagina for at least 1 week or until your health care provider approves.  Do not have sex  for at least 1 week or until your health care provider approves.  Do not exercise, such as running or biking, until your health care provider approves.  Do not swim or use a hot tub until your health care provider approves. You may shower or take a sitz bath. General instructions  Take over-the-counter and prescription medicines only as told by your health care provider.  Use a sanitary napkin until the bleeding stops.  Keep all follow-up visits as told by your health care provider. This is important. Contact a health care provider if:  You have more redness, swelling, or pain around your biopsy site.  You have more fluid or blood coming from your biopsy site.  Your biopsy site feels warm to the touch.  Your pain is not controlled with medicine. Get help right away if you have:  Heavy bleeding from the vulva.  Pus or a bad smell coming from your biopsy site.  A fever.  Lower abdominal pain. Summary  After the procedure, it is common to have slight bleeding and discomfort at the biopsy site.  Follow instructions from your health care provider after your biopsy. Make sure you clean the area with water and mild soap. Pat the area dry.  Take sitz baths as needed to help with pain and discomfort. Leave any sutures in place.  Check your biopsy site for signs of infection, which may include more redness, swelling, pain, fluid,  or blood, or feeling warm to the touch.  Get help right away if you have heavy bleeding, a fever, pus or a bad smell, or pain in the lower abdomen. This information is not intended to replace advice given to you by your health care provider. Make sure you discuss any questions you have with your health care provider. Document Released: 04/03/2012 Document Revised: 10/18/2017 Document Reviewed: 10/18/2017 Elsevier Patient Education  2020 [employer].

## 2018-11-19 NOTE — Progress Notes (Signed)
  Subjective:     Patient ID: Shelby Becker, female   DOB: 1955-12-22, 63 y.o.   MRN: [id number]  HPI Pap History: 10/22/17 Neg:Neg HR HPV  Here for vulvar biopsy for chronic vulvitis.  I have questioned lichen planus.   She has keratosis of the left vulva, which she may want removed.  It is more prominent to her and rubbing on her underwear or pants on occasion.  Review of Systems LMP: 2006 Contraception: Postmenopausal    Objective:   Physical Exam The vulva demonstrates erythema of the vestibule. She has small sebaceous cysts of the right and left labia majora bilaterally.   Biopsy of right labia minora.  Consent for procedure.  Betadine prep.  Local 1% lidocaine - lot  [id number], expiration  10/30/19. 4 mm punch biopsy taken. Tissue to pathology.  Single suture of 3/0 vicryl.  Minimal EBL.  No complications.      Assessment:     Chronic vulvitis.  Possible lichen planus.  Vulvar sebaceous cysts.  Plan:     Fu biopsy results. Instructions and precautions given. She would like to keep her care simple at the time and not see any vulvar specialist.  I do not recommend removal of the sebaceous cysts, which would require more extensive incisions due to the multiple cysts present.  Fu prn.

## 2018-11-21 ENCOUNTER — Telehealth: Payer: Self-pay | Admitting: Obstetrics and Gynecology

## 2018-11-21 NOTE — Telephone Encounter (Signed)
Patient says would like to speak with nurse about the procedure she had on Tuesday.

## 2018-11-21 NOTE — Telephone Encounter (Signed)
Spoke with patient. Patient was seen in office on 11/19/18, vulvar biopsy done at this time. Patient states she has another spot on the left side of vulva that is black that she would like Dr. Quincy Simmonds to take a closer look at. She is unsure, this may be a blackhead. Denies any pain or d/c, occassional irration. OV scheduled for 7/27 at 4:30pm with Dr. Quincy Simmonds.   Routing to provider for final review. Patient is agreeable to disposition. Will close encounter.

## 2018-11-25 ENCOUNTER — Ambulatory Visit: Payer: BC Managed Care – PPO | Admitting: Obstetrics and Gynecology

## 2018-11-25 ENCOUNTER — Encounter: Payer: Self-pay | Admitting: Obstetrics and Gynecology

## 2018-11-25 ENCOUNTER — Other Ambulatory Visit: Payer: Self-pay

## 2018-11-25 VITALS — HR 88 | Temp 97.2°F | Resp 14 | Ht 67.0 in | Wt 190.8 lb

## 2018-11-25 DIAGNOSIS — D1801 Hemangioma of skin and subcutaneous tissue: Secondary | ICD-10-CM

## 2018-11-25 DIAGNOSIS — I781 Nevus, non-neoplastic: Secondary | ICD-10-CM | POA: Diagnosis not present

## 2018-11-25 DIAGNOSIS — L280 Lichen simplex chronicus: Secondary | ICD-10-CM

## 2018-11-25 MED ORDER — CLOBETASOL PROPIONATE 0.05 % EX OINT: 1.0000 "application " | TOPICAL_OINTMENT | Freq: Two times a day (BID) | CUTANEOUS | 0 refills | Status: DC

## 2018-11-25 NOTE — Progress Notes (Signed)
GYNECOLOGY  VISIT   HPI: 63 y.o.   Married  Caucasian  female   [phone] with Patient's last menstrual period was 05/01/2004 (approximate).   here for vulvar recheck     Feels tugging on her left labia majora, medial area.  She noticed a black area which she is concerned about.   We did a bx on right labia.  Dx of lichenoid dermatitis with possible drug eruption.  She thinks she has a rectocele with a tight perineum.   GYNECOLOGIC HISTORY: Patient's last menstrual period was 05/01/2004 (approximate). Contraception:  Postmenopausal Menopausal hormone therapy:  none Last mammogram:  11/26/17 BIRADS 1 negative/density b Last pap smear:   10/22/17 Neg:Neg HR HPV        OB History    Gravida  3   Para  3   Term  3   Preterm  0   AB  0   Living  2     SAB  0   TAB  0   Ectopic  0   Multiple  0   Live Births  3              Patient Active Problem List   Diagnosis Date Noted  . Chronic vulvitis 10/22/2017  . Intra-abdominal fluid collection s/p perc drainage 03/23/2015 03/23/2015  . Chronic cholecystitis with calculus s/p lap chole 03/12/2015 03/22/2015  . Hypertension     Past Medical History:  Diagnosis Date  . Asthma    "Exercise induced or cough variant" - very rarely use inhaler  . Bile duct leak    bile drainage tube in place.  . Chronic cholecystitis with calculus s/p lap chole 03/12/2015 03/22/2015  . Depression    Tx'd in August 12, 1989 after a death of an infant  . Dyspareunia   . History of chronic cough    since gallbladder surgery-"fluid built up under right lung", "is improved"  . Hypertension   . Lung nodules 08/12/17   followed by Dr.Gerhardt  . Pneumonia    about 10-12 years ago  . Raynaud's disease    in feet  . Tinnitus of right ear     Past Surgical History:  Procedure Laterality Date  . BREAST SURGERY  1994   benign cyst removed right breast  . CESAREAN SECTION  1991  . CHOLECYSTECTOMY     8'16 laparoscopic- bile leak in abdomen,  ERD visit -drain placed in to drain about 1 liter bile fluid  . COLONOSCOPY    . DILATION AND CURETTAGE OF UTERUS    . LAPAROSCOPIC CHOLECYSTECTOMY SINGLE SITE WITH INTRAOPERATIVE CHOLANGIOGRAM  03/12/2015   Dr Johney Maine  . LUNG BIOPSY N/A 06/08/2017   Procedure: LUNG BIOPSY;  Surgeon: Grace Isaac, MD;  Location: [facility];  Service: Thoracic;  Laterality: N/A;  . VIDEO BRONCHOSCOPY WITH ENDOBRONCHIAL NAVIGATION N/A 06/08/2017   Procedure: VIDEO BRONCHOSCOPY WITH ENDOBRONCHIAL NAVIGATION;  Surgeon: Grace Isaac, MD;  Location: [facility];  Service: Thoracic;  Laterality: N/A;    Current Outpatient Medications  Medication Sig Dispense Refill  . acetaminophen (TYLENOL) 325 MG tablet Take 650 mg by mouth every 6 (six) hours as needed for moderate pain (FOR PAIN.).     Marland Kitchen EPIPEN 2-PAK 0.3 MG/0.3ML SOAJ injection Inject 0.3 mg into the muscle once as needed (allergic reaction).     Marland Kitchen losartan (COZAAR) 100 MG tablet Take 50 mg by mouth daily.     Marland Kitchen PROAIR HFA 108 (90 BASE) MCG/ACT inhaler Inhale 2 puffs into  the lungs daily as needed (before exercising).      No current facility-administered medications for this visit.      ALLERGIES: Nsaids, Bee venom, and Hibiclens [chlorhexidine]  Family History  Problem Relation Age of Onset  . Thyroid disease Mother        hypothyroid  . Hypertension Father   . Thyroid disease Sister        [place]ot[place]    Social History   Socioeconomic History  . Marital status: Married    Spouse name: Not on file  . Number of children: Not on file  . Years of education: Not on file  . Highest education level: Not on file  Occupational History  . Not on file  Social Needs  . Financial resource strain: Not on file  . Food insecurity    Worry: Not on file    Inability: Not on file  . Transportation needs    Medical: Not on file    Non-medical: Not on file  Tobacco Use  . Smoking status: Former Smoker    Quit date: 05/01/1984    Years since quitting: 34.5  .  Smokeless tobacco: Never Used  Substance and Sexual Activity  . Alcohol use: No    Alcohol/week: 1.0 standard drinks    Types: 1 Standard drinks or equivalent per week    Comment: occasionally 3-4 times a month  . Drug use: No  . Sexual activity: Not Currently    Partners: Male    Birth control/protection: Post-menopausal  Lifestyle  . Physical activity    Days per week: Not on file    Minutes per session: Not on file  . Stress: Not on file  Relationships  . Social Herbalist on phone: Not on file    Gets together: Not on file    Attends religious service: Not on file    Active member of club or organization: Not on file    Attends meetings of clubs or organizations: Not on file    Relationship status: Not on file  . Intimate partner violence    Fear of current or ex partner: Not on file    Emotionally abused: Not on file    Physically abused: Not on file    Forced sexual activity: Not on file  Other Topics Concern  . Not on file  Social History Narrative  . Not on file    Review of Systems  Constitutional: Negative.   HENT: Negative.   Eyes: Negative.   Respiratory: Negative.   Cardiovascular: Negative.   Gastrointestinal: Negative.   Endocrine: Negative.   Genitourinary: Negative.   Musculoskeletal: Negative.   Skin: Negative.   Allergic/Immunologic: Negative.   Neurological: Negative.   Hematological: Negative.   Psychiatric/Behavioral: Negative.     PHYSICAL EXAMINATION:    Pulse 88   Temp (!) 97.2 F (36.2 C) (Temporal)   Resp 14   Ht 5\' 7"  (1.702 m)   Wt 190 lb 12.8 oz (86.5 kg)   LMP 05/01/2004 (Approximate)   BMI 29.88 kg/m     General appearance: alert, cooperative and appears stated age  Pelvic: External genitalia: Cherry hemangioma of left labia majora.  Suture of the right labia minora.  Sebaceous cysts of the right and left labia majora.               Urethra:  normal appearing urethra with no masses, tenderness or lesions  Bartholins and Skenes: normal                 Vagina: normal appearing vagina with normal color and discharge, no lesions              Cervix: no lesions                Bimanual Exam:  Uterus:  normal size, contour, position, consistency, mobility, non-tender              Adnexa: no mass, fullness, tenderness               Chaperone was present for exam.  ASSESSMENT  Cherry hemangioma.  Lichenoid dermatitis with possible drug eruption.   PLAN  Reassuance regarding the cherry hemangioma.  Symptoms of pruritis discussed.  Review of lichenoid dermatitis and possible drug etiologies.  Rx for clobetasol ointment. Instructed in use. FU prn.   An After Visit Summary was printed and given to the patient.  ___15___ minutes face to face time of which over 50% was spent in counseling.

## 2018-11-29 ENCOUNTER — Telehealth: Payer: Self-pay

## 2018-11-29 NOTE — Telephone Encounter (Signed)
Patient contacted the office for clarification on her MRI scan that she did not want to have done if possible because of ototoxicity of gadolinium.  Per Dr. Servando Snare patient is to come back to the office in 6 months with a CT scan of ABD/CHEST instead of MRI and re-evaluate then.  Patient made aware and is agreeable to plan.

## 2018-12-10 ENCOUNTER — Ambulatory Visit
Admission: RE | Admit: 2018-12-10 | Discharge: 2018-12-10 | Disposition: A | Discharge status: Home / Self Care | Payer: BC Managed Care – PPO | Source: Ambulatory Visit | Attending: Otolaryngology | Admitting: Otolaryngology

## 2018-12-10 ENCOUNTER — Other Ambulatory Visit: Payer: Self-pay

## 2018-12-10 DIAGNOSIS — H93A9 Pulsatile tinnitus, unspecified ear: Secondary | ICD-10-CM

## 2018-12-10 MED ORDER — IOPAMIDOL (ISOVUE-370) INJECTION 76%
75.0000 mL | Freq: Once | INTRAVENOUS | Status: AC | PRN
  Administered 2018-12-10: 75 mL via INTRAVENOUS

## 2019-02-05 IMAGING — PT NM PET TUM IMG INITIAL (PI) SKULL BASE T - THIGH
8 series · 25 of 25 positions shown · non-contrast
Comparison: CT 04/17/2017

CLINICAL DATA: Initial treatment strategy for pulmonary nodules.

EXAM:
NUCLEAR MEDICINE PET SKULL BASE TO THIGH
TECHNIQUE: 9.5 mCi F-18 FDG was injected intravenously. Full-ring PET imaging
was performed from the skull base to thigh after the radiotracer. CT
data was obtained and used for attenuation correction and anatomic
localization.
FASTING BLOOD GLUCOSE:  Value: 103 mg/dl

[Series 3: pet sk_thigh ac · axial · 5.0mm · 4.07mm/px · z∈[-1352,-416]mm · 5 of 235 slices shown]
[im 1/235]
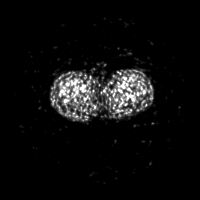
[im 59/235]
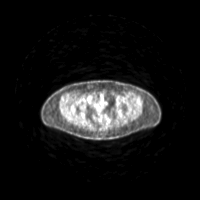
[im 118/235]
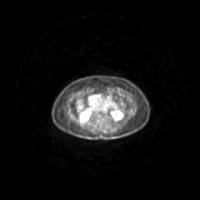
[im 176/235]
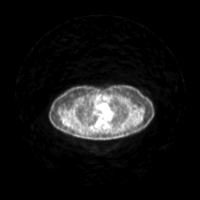
[im 235/235]
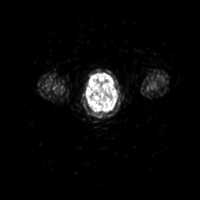

[Series 4: ct sk_thigh 5.0 b31f · axial · 5.0mm · 0.98mm/px · z∈[-1352,-416]mm · 5 of 235 slices shown]
[im 1/235]
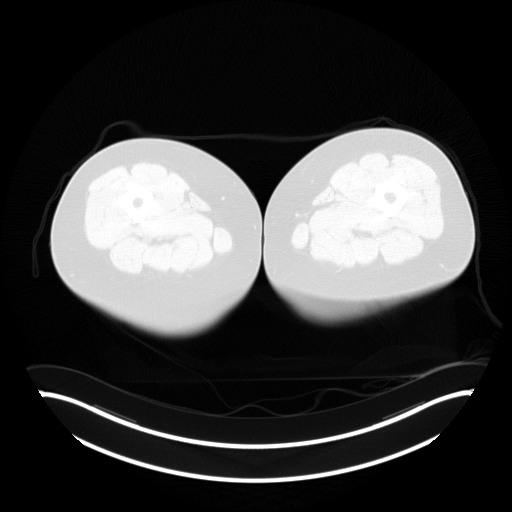
[im 59/235]
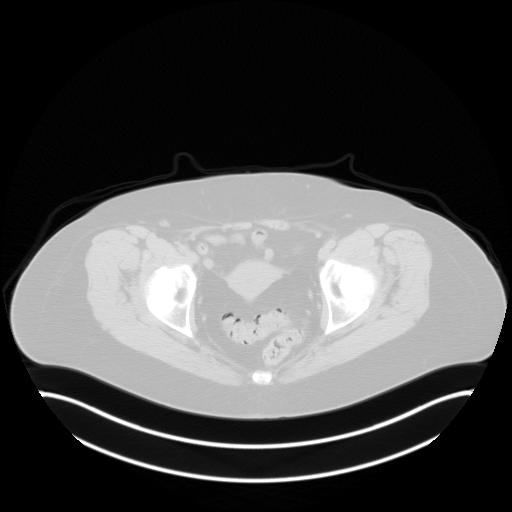
[im 118/235]
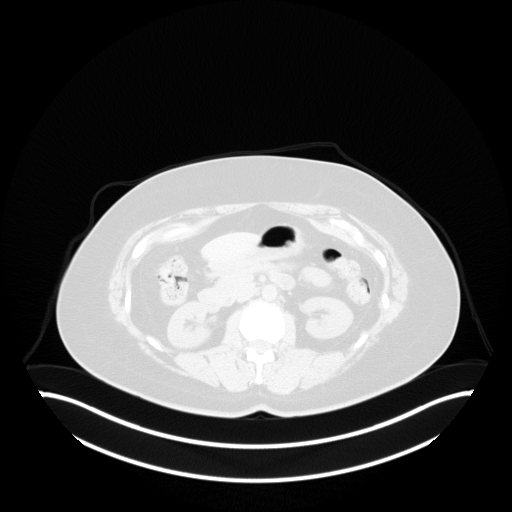
[im 176/235]
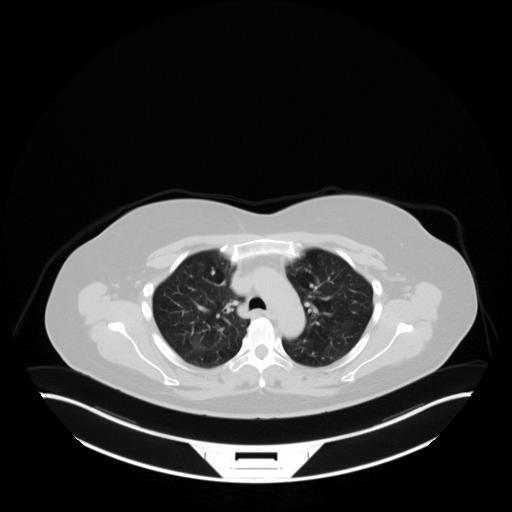
[im 235/235  brain]
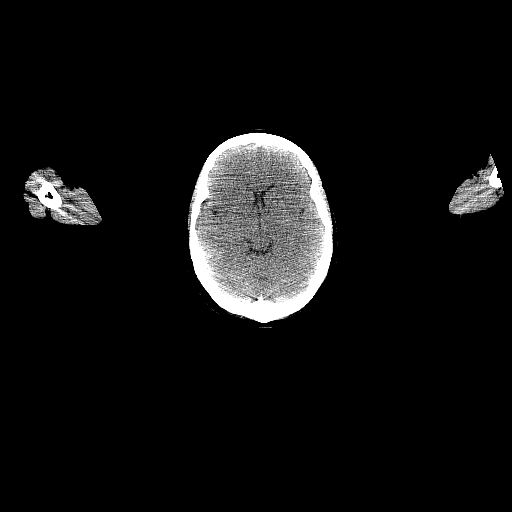

[Series 5: pet sk_thigh nac · axial · 5.0mm · 4.07mm/px · z∈[-1352,-416]mm · 5 of 235 slices shown]
[im 1/235]
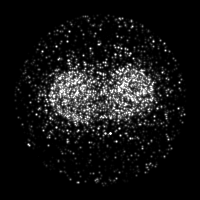
[im 59/235]
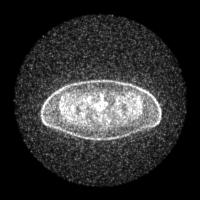
[im 118/235]
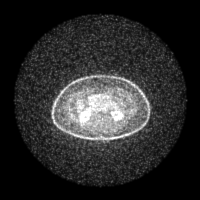
[im 176/235]
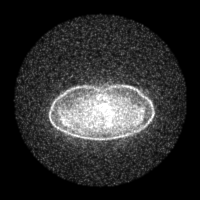
[im 235/235]
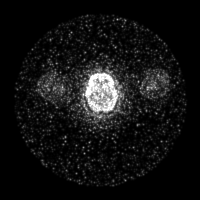

[Series 8: ct sk_thigh 5.0 (id) lung_bone · axial · 5.0mm · 0.64mm/px · 1 of 64 slices shown]
[im 1/64  bone]
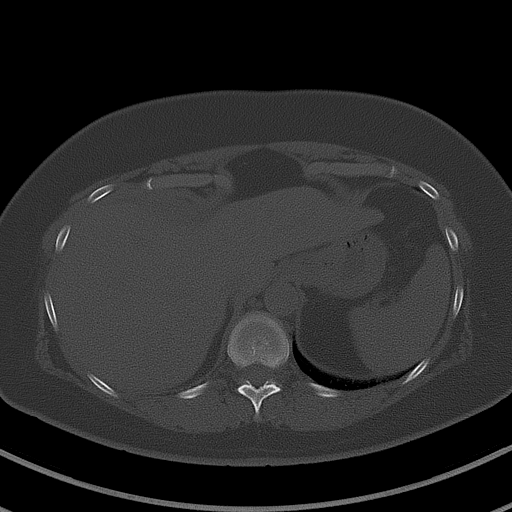

[Series 603: range-ct sk_thigh 5.0 (id)<alpha range> · 2 of 80 slices shown (1 of 2)]
[im 1/80]
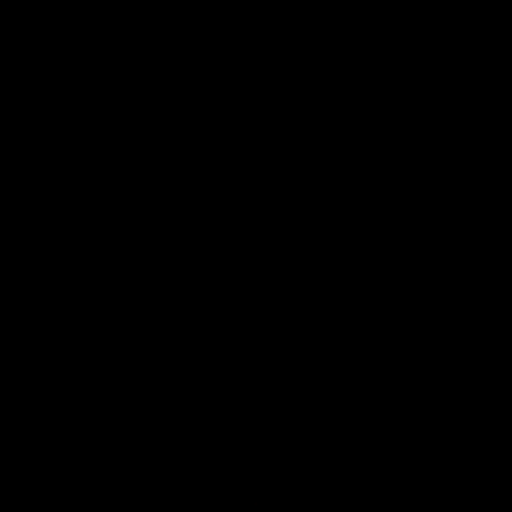
[im 80/80]
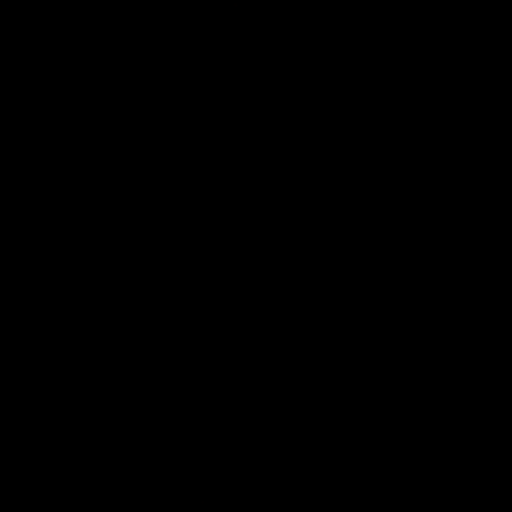

[Series 604: mip range · coronal · 1.94mm/px · 1 of 32 slices shown]
[im 1/32]
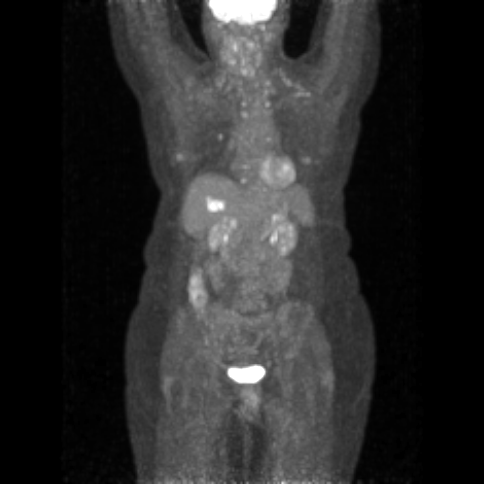

[Series 605: range-ct sk_thigh 5.0 (id)<alpha range> · 5 of 226 slices shown (2 of 2)]
[im 1/226]
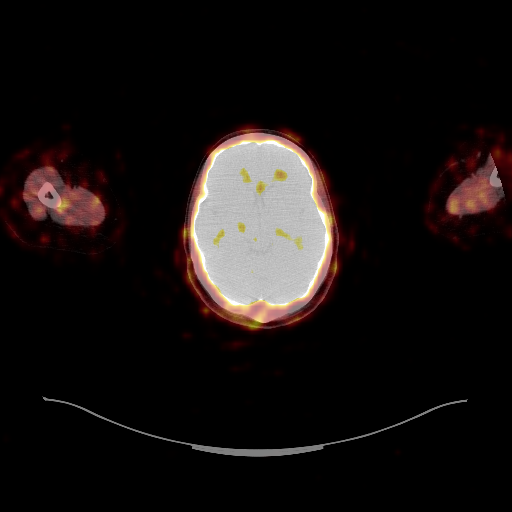
[im 57/226]
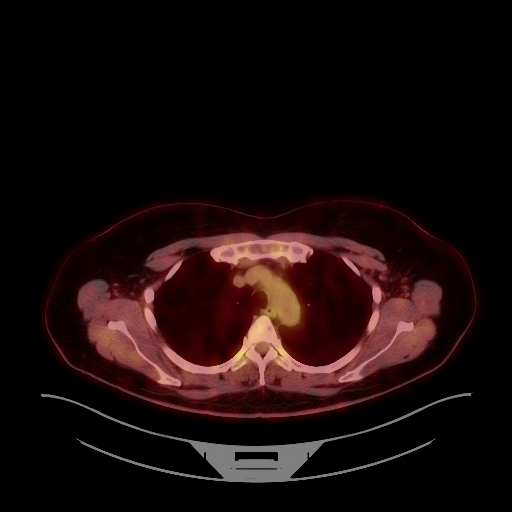
[im 113/226]
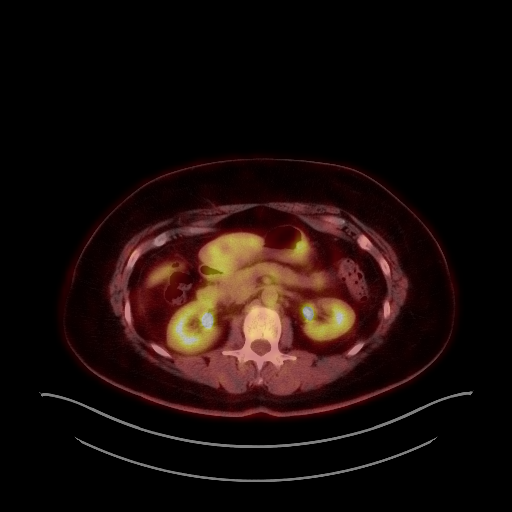
[im 169/226]
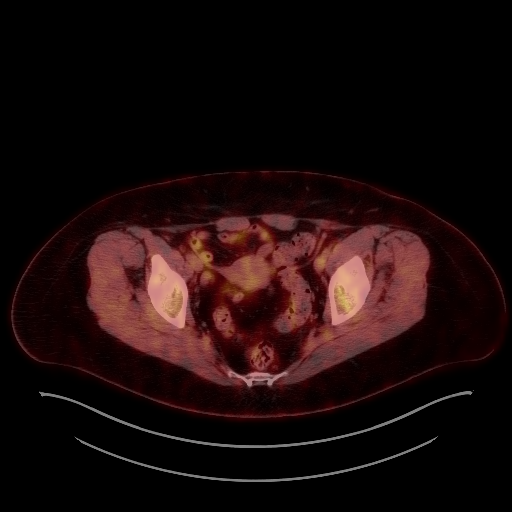
[im 226/226]
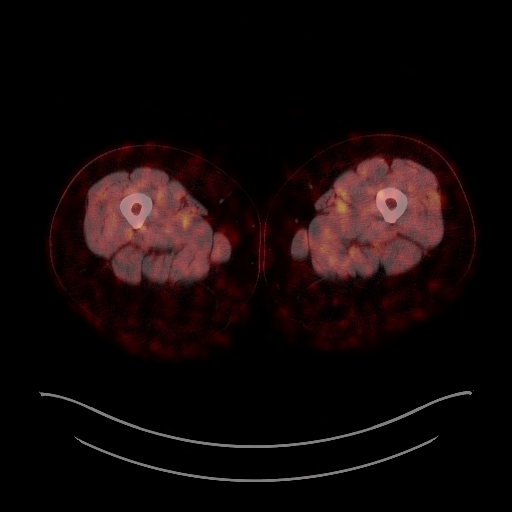

[Series 1032: results mm oncology reading · 0.68mm/px · 1 of 6 slices shown]
[im 1/6]
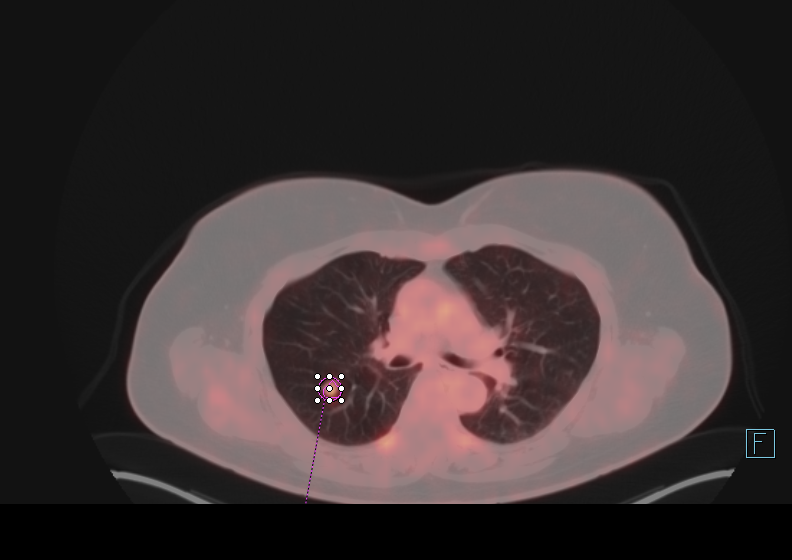

[25 of 25 positions shown; findings below may reference images not displayed]

FINDINGS: NECK

No hypermetabolic lymph nodes in the neck.

CHEST

Bilateral pulmonary nodules which have metabolic activity. For
example RIGHT upper lobe nodule measuring 11 mm (image 67 series 4)
with SUV max 4 3. Medial LEFT lower lobe nodule measuring 9.0 mm
(image 84 series 4) SUV max 4.0 some. Small nodules measurable
metabolic activity

No hypermetabolic mediastinal lymph nodes.

No hypermetabolic axillary supraclavicular nodes

ABDOMEN/PELVIS

Hypermetabolic lesion along the anterior margin of the liver
measures 2.6 cm by 2.1 (image 134, series 2. With SUV max equal
15.2. This lesion is subcapsular with mild extend into the hepatic
parenchyma. This is same site is large post cholecystectomy biloma
requiring drainage on CT of 04/06/2015

No additional hypermetabolic lesions liver. LEFT adrenal gland has
mild metabolic activity just above liver renal lesion.

There is focal activity in the ascending colon associated with a
mass like region bowel wall thickening measuring 2.5 cm ( image 13
series) with SUV max 7. Band of the bowel is normal.

Uterus and ovaries appear radiotracer

SKELETON

No focal hypermetabolic activity to suggest skeletal metastasis.
IMPRESSION: 1. Hypermetabolic lesion along the anterior margin of the liver at
site of prior biloma. Favor chronic infection / inflammation however
cannot exclude a concomitant carcinoma although less favored.
2. Hypermetabolic round pulmonary nodules concerning for pulmonary
metastasis.
3. No clear primary lesion identified. Masslike thickening in the
ascending colon could represent colorectal carcinoma versus
nondistended bowel. No hypermetabolic abdominopelvic adenopathy
present.
4. Question utility ofliver biopsy given with history of biloma and
potential chronic infection. RIGHT upper lobe nodule biopsy may be
most meaningful (image 63 series 5). Colonoscopy to evaluate the
ascending colon may also be warranted.

These results will be called to the ordering clinician or
representative by the Radiologist Assistant, and communication
documented in the PACS or zVision Dashboard.

## 2019-04-16 ENCOUNTER — Other Ambulatory Visit: Payer: Self-pay | Admitting: Cardiothoracic Surgery

## 2019-04-16 DIAGNOSIS — R911 Solitary pulmonary nodule: Secondary | ICD-10-CM

## 2019-05-28 ENCOUNTER — Ambulatory Visit: Payer: BC Managed Care – PPO

## 2019-06-05 ENCOUNTER — Other Ambulatory Visit: Payer: Self-pay | Admitting: *Deleted

## 2019-06-05 ENCOUNTER — Other Ambulatory Visit: Payer: BC Managed Care – PPO

## 2019-06-05 ENCOUNTER — Ambulatory Visit: Payer: BC Managed Care – PPO | Admitting: Cardiothoracic Surgery

## 2019-06-05 DIAGNOSIS — R918 Other nonspecific abnormal finding of lung field: Secondary | ICD-10-CM

## 2019-06-06 ENCOUNTER — Ambulatory Visit: Payer: Medicare PPO

## 2019-06-18 ENCOUNTER — Ambulatory Visit: Payer: BC Managed Care – PPO

## 2019-07-07 ENCOUNTER — Ambulatory Visit: Payer: Medicare PPO | Attending: Internal Medicine

## 2019-07-07 DIAGNOSIS — Z20822 Contact with and (suspected) exposure to covid-19: Secondary | ICD-10-CM

## 2019-07-08 LAB — NOVEL CORONAVIRUS, NAA: SARS-CoV-2, NAA: NOT DETECTED

## 2019-07-24 ENCOUNTER — Ambulatory Visit
Admission: RE | Admit: 2019-07-24 | Discharge: 2019-07-24 | Disposition: A | Discharge status: Home / Self Care | Payer: Medicare PPO | Source: Ambulatory Visit | Attending: Cardiothoracic Surgery | Admitting: Cardiothoracic Surgery

## 2019-07-24 ENCOUNTER — Ambulatory Visit: Payer: Medicare PPO | Admitting: Cardiothoracic Surgery

## 2019-07-24 ENCOUNTER — Other Ambulatory Visit: Payer: Self-pay

## 2019-07-24 VITALS — BP 160/87 | HR 80 | Temp 97.6°F | Resp 20 | Ht 67.0 in | Wt 187.0 lb

## 2019-07-24 DIAGNOSIS — R918 Other nonspecific abnormal finding of lung field: Secondary | ICD-10-CM

## 2019-07-24 NOTE — Progress Notes (Signed)
[address]       [place] [address]             [phone]      Shelby Becker [facility] Medical Record #[id number] Date of Birth: [birth year]  Referring: Hulan Fess, MD Primary Care: Hulan Fess, MD Primary Cardiologist: No primary care provider on file.   Chief Complaint:   POST OP FOLLOW UP 06/08/2017 OPERATIVE REPORT PREOPERATIVE DIAGNOSIS:  Multiple pulmonary nodules, right lower lobe, right upper lobe, left lower lobe. PROCEDURE PERFORMED: 1. Video bronchoscopy, navigation bronchoscopy. 2. Three separate lesions with transbronchial biopsies and aspirations     with fluoroscopic assistance. SURGEON:  Lanelle Bal, MD.  Path: FINAL DIAGNOSIS Diagnosis Cytology of all three lesions negative except : Diagnosis TRANSBRONCHIAL NEEDLE ASPIRATION (B), NAVIGATION RIGHT LOWER LOBE, NEEDLE ASPIRATION (SPECIMEN 2 OF 9 COLLECTED 06/08/2017) NO MALIGNANT CELLS IDENTIFIED. VAGUE GRANULOMATOUS INFLAMMATION. Preliminary Diagnosis DX: NO MALIGNANT CELLS IDENTIFIED (NDK) JOSHUA KISH MD   1. Lung, biopsy, Right Lower Lobe - BENIGN LUNG PARENCHYMA, SCANT. - THERE IS NO EVIDENCE OF MALIGNANCY. 2. Lung, biopsy, Left Lower Lobe - BENIGN LUNG PARENCHYMA, SCANT. - THERE IS NO EVIDENCE OF MALIGNANCY. 3. Lung, biopsy, Right Upper Lobe - BENIGN LUNG PARENCHYMA. - THERE IS NO EVIDENCE OF MALIGNANCY. Enid Cutter MD    History of Present Illness:      Patient returns to the office today with a 8 months  follow-up CT scan .  Previously she had navigation bronchoscopy to the right lower and left lower and right upper lobe lung masses.  She tolerated procedure without complication. the the PET scan initially was interpreted as metastatic disease, it does not appear that that is the case.  All 3 areas biopsied showed no evidence of malignancy,  The right lower lobe mass cytology had evidence of a granulomatous disease.    No cough    Past Medical  History:  Diagnosis Date  . Asthma    "Exercise induced or cough variant" - very rarely use inhaler  . Bile duct leak    bile drainage tube in place.  . Chronic cholecystitis with calculus s/p lap chole 03/12/2015 03/22/2015  . Depression    Tx'd in 07/25/1989 after a death of an infant  . Dyspareunia   . History of chronic cough    since gallbladder surgery-"fluid built up under right lung", "is improved"  . Hypertension   . Lung nodules 06/2017   followed by Dr.Dywane Peruski  . Pneumonia    about 10-12 years ago  . Raynaud's disease    in feet  . Tinnitus of right ear      Social History   Tobacco Use  Smoking Status Former Smoker  . Quit date: 05/01/1984  . Years since quitting: 35.2  Smokeless Tobacco Never Used    Social History   Substance and Sexual Activity  Alcohol Use No  . Alcohol/week: 1.0 standard drinks  . Types: 1 Standard drinks or equivalent per week   Comment: occasionally 3-4 times a month     Allergies  Allergen Reactions  . Nsaids Other (See Comments)    Low EGFR  . Bee Venom Swelling    Local swelling at site Has epipen  . Hibiclens [Chlorhexidine]     Current Outpatient Medications  Medication Sig Dispense Refill  . acetaminophen (TYLENOL) 325 MG tablet Take 650 mg by mouth every 6 (six) hours as needed for moderate pain (FOR PAIN.).     Marland Kitchen  EPIPEN 2-PAK 0.3 MG/0.3ML SOAJ injection Inject 0.3 mg into the muscle once as needed (allergic reaction).     Marland Kitchen losartan (COZAAR) 100 MG tablet Take 50 mg by mouth daily.     Marland Kitchen PROAIR HFA 108 (90 BASE) MCG/ACT inhaler Inhale 2 puffs into the lungs daily as needed (before exercising).     . clobetasol ointment (TEMOVATE) 0.31 % Apply 1 application topically 2 (two) times daily. Use this for 2 weeks at a time. (Patient not taking: Reported on 07/24/2019) 60 g 0   No current facility-administered medications for this visit.       Physical Exam: BP (!) 160/87   Pulse 80   Temp 97.6 F (36.4 C) (Skin)    Resp 20   Ht '5\' 7"'  (1.702 m)   Wt 84.8 kg   LMP 05/01/2004 (Approximate)   SpO2 96% Comment: RA  BMI 29.29 kg/m  General appearance: alert, cooperative and no distress Head: Normocephalic, without obvious abnormality, atraumatic Lymph nodes: Cervical, supraclavicular, and axillary nodes normal. Resp: clear to auscultation bilaterally Cardio: regular rate and rhythm, S1, S2 normal, no murmur, click, rub or gallop GI: soft, non-tender; bowel sounds normal; no masses,  no organomegaly Extremities: extremities normal, atraumatic, no cyanosis or edema and Homans sign is negative, no sign of DVT Neurologic: Grossly normal  Diagnostic Studies & Laboratory data:     Recent Radiology Findings:  CT CHEST WO CONTRAST  Result Date: 07/24/2019 CLINICAL DATA:  Follow-up lung nodule identified on previous CT. EXAM: CT CHEST WITHOUT CONTRAST TECHNIQUE: Multidetector CT imaging of the chest was performed following the standard protocol without IV contrast. COMPARISON:  11/14/2018 FINDINGS: Cardiovascular: Heart size appears within normal limits. No pericardial effusion identified. Aortic atherosclerosis. No pericardial effusion. Mediastinum/Nodes: Normal appearance of the thyroid gland. The trachea appears patent and is midline. Normal appearance of the esophagus. No mediastinal or hilar adenopathy. Lungs/Pleura: Bilateral pulmonary nodules are identified. The index nodule within the right lower lobe measures 1.3 cm, image 64/8 Index posteromedial left lower lobe lung nodule measures 1.4 cm, unchanged from the previous exam, image 106/8. Anterior right upper lobe lung nodule is unchanged measuring 4 mm, image 49/8. Groundglass attenuating nodule within the right upper lobe measures 6 mm, image 24/8. Unchanged. Medial right lower lobe lung nodule is unchanged measuring 8 mm, image 107/8. Upper Abdomen: The mass overlying the anterior right lobe of liver measures 2.7 by 5.5 cm, image 122/2. Previously this  measured the same. No acute abnormality identified within the upper abdomen. Musculoskeletal: No chest wall mass or suspicious bone lesions identified. IMPRESSION: 1. No acute cardiopulmonary abnormalities. Unchanged appearance of multifocal (previously noted to be FDG avid) pulmonary nodules. 2. Unchanged appearance of low-attenuation mass overlying the anterior right lobe of liver compared with 11/14/2018. 3. No new or progressive findings identified. Aortic Atherosclerosis (ICD10-I70.0). - Electronically Signed   By: Kerby Moors M.D.   On: 07/24/2019 14:42     I have independently reviewed the above radiology studies  and reviewed the findings with the patient.    Ct Chest Wo Contrast  Result Date: 11/14/2018 CLINICAL DATA:  Follow-up non solid lung nodules. EXAM: CT CHEST WITHOUT CONTRAST TECHNIQUE: Multidetector CT imaging of the chest was performed following the standard protocol without IV contrast. COMPARISON:  12/13/2017 FINDINGS: Cardiovascular: Normal heart size. No pericardial effusion. Mild aortic atherosclerosis. Mediastinum/Nodes: Normal appearance of the thyroid gland. The trachea appears patent and is midline. Normal appearance of the esophagus. No supraclavicular, axillary, mediastinal or hilar  adenopathy. Lungs/Pleura: No pleural effusion identified. Multiple pulmonary nodules are again noted: -The solid nodule within the right lower lobe measures 1.3 cm, image 63/8. Similar to previous exam. -5 mm anterior right upper lobe lung nodule is also stable, image 52/8. -Unchanged ground-glass attenuating nodule in the right upper lobe measuring 6 mm, image 26/8. -The medial right lower lobe lung nodule is stable measuring 9 mm, image 104/8. -Posteromedial left lower lobe lung nodule measures 1.4 cm, image 97/8. Previously 1.2 cm. -New nodular density within the left upper lobe measuring 4 mm is favored to represent an area of mucous plugging, image 34/8. Upper Abdomen: Mass overlying the  anterior right lobe of liver measures 6.1 x 2.6 cm, image 63/6., image 120/2 and 63/6. On the previous exam this measured 5.1 x 1.9 cm. Musculoskeletal: No chest wall mass or suspicious bone lesions identified. IMPRESSION: 1. No significant interval change in the appearance of previously noted FDG avid bilateral solid pulmonary nodules. Also unchanged is a right upper lobe ground-glass. 2. There is a low-density mass overlying the anterior right lobe of liver which demonstrates continued interval growth. On PET-CT from 05/08/17 this was FDG avid and at that time was felt to likely reflect inflammatory changes secondary to prior biloma. Given the continued growth I am concerned that this represents tumor. Recommend further evaluation with contrast enhanced liver MRI. Electronically Signed   By: Kerby Moors M.D.   On: 11/14/2018 10:21    Ct Chest Wo Contrast  Result Date: 12/13/2017 CLINICAL DATA:  Follow-up hypermetabolic pulmonary nodules. No evidence of malignancy on bronchoscopic biopsy performed 06/08/2017. Former smoker. EXAM: CT CHEST WITHOUT CONTRAST TECHNIQUE: Multidetector CT imaging of the chest was performed following the standard protocol without IV contrast. COMPARISON:  06/08/2017 chest radiograph. 04/17/2017 chest CT. 05/08/2017 PET-CT. FINDINGS: Cardiovascular: Normal heart size. No significant pericardial effusion/thickening. Great vessels are normal in course and caliber. Mediastinum/Nodes: No discrete thyroid nodules. Unremarkable esophagus. No axillary adenopathy. Top-normal 0.8 cm right pericardiophrenic node (series 2/image 118) is stable. No pathologically enlarged mediastinal or discrete hilar nodes on this noncontrast scan. Lungs/Pleura: No pneumothorax. No pleural effusion. There are at least 5 scattered solid pulmonary nodules, all stable since 04/17/2017 chest CT, largest 1.2 cm in the right lower lobe (series 8/image 71), 1.2 cm in the medial left lower lobe (series 8/image 102),  0.9 cm in the medial right lower lobe (series 8/image 106) and 0.6 cm in the medial right middle lobe (series 8/image 86). Stable 0.7 cm ground-glass apical right upper lobe nodule (series 8/image 33). No acute consolidative airspace disease, lung masses or new significant pulmonary nodules. Stable parenchymal band in medial right middle lobe. Upper abdomen: Cholecystectomy. Stable bandlike soft tissue attenuation in the anterior right perihepatic space measuring up to 1.8 cm thickness (series 2/image 130). Musculoskeletal: No aggressive appearing focal osseous lesions. Mild thoracic spondylosis. IMPRESSION: 1. Scattered solid pulmonary nodules are all stable since 04/17/2017 chest CT. No new or enlarging pulmonary nodules. Given hypermetabolism within these nodules on prior PET-CT, continued chest CT surveillance is warranted in 6-12 months. 2. Stable top-normal right pericardiophrenic node. No pathologically enlarged thoracic nodes. 3. Stable bandlike soft tissue attenuation in the anterior right perihepatic space, nonspecific but presumably scar tissue related to prior biloma in this location. Electronically Signed   By: Ilona Sorrel M.D.   On: 12/13/2017 12:33    I have independently reviewed the above radiology studies  and reviewed the findings with the patient.   Recent Lab Findings: Lab  Results  Component Value Date   WBC 8.4 06/08/2017   HGB 15.3 (H) 06/08/2017   HCT 44.7 06/08/2017   PLT 213 06/08/2017   GLUCOSE 101 (H) 06/08/2017   ALT 25 06/08/2017   AST 23 06/08/2017   NA 140 06/08/2017   K 4.2 06/08/2017   CL 108 06/08/2017   CREATININE 1.05 (H) 06/08/2017   BUN 15 06/08/2017   CO2 20 (L) 06/08/2017   INR 0.93 06/08/2017      Assessment / Plan:   #1 no significant interval change in appearance of bilateral pulmonary nodules or right upper lobe groundglass area- not intervention indicated now - continue follow up   #2 low density area over the right lobe of liver shows some  interval growth, this was previously thought to be related to her previous biloma-on changed on CT- MRI had been recommended but patient did not want to have MRI due to risks of contrast   Plan repeat ct chest one year    Grace Isaac MD      [address] [place] [address] Office [phone]   Beeper [phone]  07/25/2019 11:20 AM

## 2020-04-26 DIAGNOSIS — L820 Inflamed seborrheic keratosis: Secondary | ICD-10-CM | POA: Diagnosis not present

## 2020-06-02 ENCOUNTER — Other Ambulatory Visit: Payer: Self-pay | Admitting: Cardiothoracic Surgery

## 2020-06-02 DIAGNOSIS — R918 Other nonspecific abnormal finding of lung field: Secondary | ICD-10-CM

## 2020-06-16 DIAGNOSIS — D2339 Other benign neoplasm of skin of other parts of face: Secondary | ICD-10-CM | POA: Diagnosis not present

## 2020-06-16 DIAGNOSIS — L821 Other seborrheic keratosis: Secondary | ICD-10-CM | POA: Diagnosis not present

## 2020-06-16 DIAGNOSIS — L820 Inflamed seborrheic keratosis: Secondary | ICD-10-CM | POA: Diagnosis not present

## 2020-07-22 ENCOUNTER — Ambulatory Visit: Payer: Medicare PPO | Admitting: Cardiothoracic Surgery

## 2020-07-22 ENCOUNTER — Ambulatory Visit
Admission: RE | Admit: 2020-07-22 | Discharge: 2020-07-22 | Disposition: A | Discharge status: Home / Self Care | Payer: Medicare PPO | Source: Ambulatory Visit | Attending: Cardiothoracic Surgery | Admitting: Cardiothoracic Surgery

## 2020-07-22 ENCOUNTER — Other Ambulatory Visit: Payer: Self-pay

## 2020-07-22 VITALS — HR 92 | Resp 20 | Ht 67.0 in | Wt 185.0 lb

## 2020-07-22 DIAGNOSIS — R918 Other nonspecific abnormal finding of lung field: Secondary | ICD-10-CM | POA: Diagnosis not present

## 2020-07-22 NOTE — Progress Notes (Signed)
[address]       [place] [address]             [phone]      Arielys E Livas [facility] Medical Record #[id number] Date of Birth: [birth year]  Referring: Hulan Fess, MD Primary Care: Hulan Fess, MD Primary Cardiologist: No primary care provider on file.   Chief Complaint:   POST OP FOLLOW UP 06/08/2017 OPERATIVE REPORT PREOPERATIVE DIAGNOSIS:  Multiple pulmonary nodules, right lower lobe, right upper lobe, left lower lobe. PROCEDURE PERFORMED: 1. Video bronchoscopy, navigation bronchoscopy. 2. Three separate lesions with transbronchial biopsies and aspirations     with fluoroscopic assistance. SURGEON:  Lanelle Bal, MD.  Path: FINAL DIAGNOSIS Diagnosis Cytology of all three lesions negative except : Diagnosis TRANSBRONCHIAL NEEDLE ASPIRATION (B), NAVIGATION RIGHT LOWER LOBE, NEEDLE ASPIRATION (SPECIMEN 2 OF 9 COLLECTED 06/08/2017) NO MALIGNANT CELLS IDENTIFIED. VAGUE GRANULOMATOUS INFLAMMATION. Preliminary Diagnosis DX: NO MALIGNANT CELLS IDENTIFIED (NDK) JOSHUA KISH MD   1. Lung, biopsy, Right Lower Lobe - BENIGN LUNG PARENCHYMA, SCANT. - THERE IS NO EVIDENCE OF MALIGNANCY. 2. Lung, biopsy, Left Lower Lobe - BENIGN LUNG PARENCHYMA, SCANT. - THERE IS NO EVIDENCE OF MALIGNANCY. 3. Lung, biopsy, Right Upper Lobe - BENIGN LUNG PARENCHYMA. - THERE IS NO EVIDENCE OF MALIGNANCY. Enid Cutter MD    History of Present Illness:      Patient returns to the office today  With   follow-up CT scan .  Previously-2019 she had navigation bronchoscopy to the right lower and left lower and right upper lobe lung masses-which radiographically was thought to represent metastatic disease.  she tolerated procedure without complication. the the PET scan initially was interpreted as metastatic disease, it does not appear that that is the case.  All 3 areas biopsied showed no evidence of malignancy,  The right lower lobe mass cytology had evidence of  a granulomatous disease.    No cough   Is a very light distant smoker who stopped when she was 65 years old and only smoked less than a pack a day for several years   Past Medical History:  Diagnosis Date  . Asthma    "Exercise induced or cough variant" - very rarely use inhaler  . Bile duct leak    bile drainage tube in place.  . Chronic cholecystitis with calculus s/p lap chole 03/12/2015 03/22/2015  . Depression    Tx'd in July 23, 1989 after a death of an infant  . Dyspareunia   . History of chronic cough    since gallbladder surgery-"fluid built up under right lung", "is improved"  . Hypertension   . Lung nodules 06/2017   followed by Dr.Gerhardt  . Pneumonia    about 10-12 years ago  . Raynaud's disease    in feet  . Tinnitus of right ear      Social History   Tobacco Use  Smoking Status Former Smoker  . Quit date: 05/01/1984  . Years since quitting: 36.2  Smokeless Tobacco Never Used    Social History   Substance and Sexual Activity  Alcohol Use No  . Alcohol/week: 1.0 standard drink  . Types: 1 Standard drinks or equivalent per week   Comment: occasionally 3-4 times a month     Allergies  Allergen Reactions  . Nsaids Other (See Comments)    Low EGFR  . Bee Venom Swelling    Local swelling at site Has epipen  . Hibiclens [Chlorhexidine]   . Singulair [Montelukast] Other (  See Comments)    Current Outpatient Medications  Medication Sig Dispense Refill  . acetaminophen (TYLENOL) 325 MG tablet Take 650 mg by mouth every 6 (six) hours as needed for moderate pain (FOR PAIN.).     Marland Kitchen EPIPEN 2-PAK 0.3 MG/0.3ML SOAJ injection Inject 0.3 mg into the muscle once as needed (allergic reaction).     Marland Kitchen losartan (COZAAR) 100 MG tablet Take 50 mg by mouth daily.     Marland Kitchen PROAIR HFA 108 (90 BASE) MCG/ACT inhaler Inhale 2 puffs into the lungs daily as needed (before exercising).     . clobetasol ointment (TEMOVATE) 0.48 % Apply 1 application topically 2 (two) times daily. Use  this for 2 weeks at a time. (Patient not taking: No sig reported) 60 g 0   No current facility-administered medications for this visit.       Physical Exam: Pulse 92   Resp 20   Ht '5\' 7"'  (1.702 m)   Wt 185 lb (83.9 kg)   LMP 05/01/2004 (Approximate)   SpO2 97% Comment: RA  BMI 28.98 kg/m  General appearance: alert, cooperative and no distress Head: Normocephalic, without obvious abnormality, atraumatic Lymph nodes: Cervical, supraclavicular, and axillary nodes normal. Resp: clear to auscultation bilaterally Cardio: regular rate and rhythm, S1, S2 normal, no murmur, click, rub or gallop GI: soft, non-tender; bowel sounds normal; no masses,  no organomegaly Extremities: extremities normal, atraumatic, no cyanosis or edema and Homans sign is negative, no sign of DVT Neurologic: Grossly normal  Diagnostic Studies & Laboratory data:     Recent Radiology Findings:  CT CHEST WO CONTRAST  Result Date: 07/22/2020 CLINICAL DATA:  Pulmonary nodule. EXAM: CT CHEST WITHOUT CONTRAST TECHNIQUE: Multidetector CT imaging of the chest was performed following the standard protocol without IV contrast. COMPARISON:  07/24/2019. FINDINGS: Cardiovascular: The heart size is normal. No substantial pericardial effusion. No thoracic aortic aneurysm. Mediastinum/Nodes: No mediastinal lymphadenopathy. No evidence for gross hilar lymphadenopathy although assessment is limited by the lack of intravenous contrast on today's study. The esophagus has normal imaging features. There is no axillary lymphadenopathy. Lungs/Pleura: 13 mm right lower lobe nodule identified previously is not substantially changed in the interval measuring 14 mm today on 68/8. Medial left lower lobe nodule measured previously at 14 mm is 13 mm on image 106/8 today. 8 mm medial right lower lobe nodule identified on the prior study is 8 mm again today on 109/8. 7 mm ground-glass opacity in the right apex is unchanged on image 29/8. Similar  scarring in the right middle lobe. No focal airspace consolidation. No pleural effusion. Upper Abdomen: 5.3 x 2.6 cm hypoattenuating lesion is identified anterior to the hepatic dome. It is unclear by imaging whether this is in the diaphragm, supradiaphragmatic or infra diaphragmatic. It does generates some minimal mass-effect on the anterior hepatic dome. Overall imaging features and size are stable in the interval (previously measured at 5.5 x 2.7 cm). This lesion is also stable comparing back to CT of 11/14/2018 (6.0 x 2.5 cm remeasured) and has been present although perhaps minimally progressive since chest CT 12/13/2017. Musculoskeletal: No worrisome lytic or sclerotic osseous abnormality. IMPRESSION: 1. Stable bilateral pulmonary nodules. Continued stability reassuring given that these nodules were FDG avid on previous PET imaging. 2. Stable 5.3 cm hypoattenuating lesion anterior to the hepatic dome. Nearly 2 years of imaging stability suggests benign etiology. Electronically Signed   By: Misty Stanley M.D.   On: 07/22/2020 13:01     I have independently reviewed the  above radiology studies  and reviewed the findings with the patient.    Ct Chest Wo Contrast  Result Date: 11/14/2018 CLINICAL DATA:  Follow-up non solid lung nodules. EXAM: CT CHEST WITHOUT CONTRAST TECHNIQUE: Multidetector CT imaging of the chest was performed following the standard protocol without IV contrast. COMPARISON:  12/13/2017 FINDINGS: Cardiovascular: Normal heart size. No pericardial effusion. Mild aortic atherosclerosis. Mediastinum/Nodes: Normal appearance of the thyroid gland. The trachea appears patent and is midline. Normal appearance of the esophagus. No supraclavicular, axillary, mediastinal or hilar adenopathy. Lungs/Pleura: No pleural effusion identified. Multiple pulmonary nodules are again noted: -The solid nodule within the right lower lobe measures 1.3 cm, image 63/8. Similar to previous exam. -5 mm anterior  right upper lobe lung nodule is also stable, image 52/8. -Unchanged ground-glass attenuating nodule in the right upper lobe measuring 6 mm, image 26/8. -The medial right lower lobe lung nodule is stable measuring 9 mm, image 104/8. -Posteromedial left lower lobe lung nodule measures 1.4 cm, image 97/8. Previously 1.2 cm. -New nodular density within the left upper lobe measuring 4 mm is favored to represent an area of mucous plugging, image 34/8. Upper Abdomen: Mass overlying the anterior right lobe of liver measures 6.1 x 2.6 cm, image 63/6., image 120/2 and 63/6. On the previous exam this measured 5.1 x 1.9 cm. Musculoskeletal: No chest wall mass or suspicious bone lesions identified. IMPRESSION: 1. No significant interval change in the appearance of previously noted FDG avid bilateral solid pulmonary nodules. Also unchanged is a right upper lobe ground-glass. 2. There is a low-density mass overlying the anterior right lobe of liver which demonstrates continued interval growth. On PET-CT from 05/08/17 this was FDG avid and at that time was felt to likely reflect inflammatory changes secondary to prior biloma. Given the continued growth I am concerned that this represents tumor. Recommend further evaluation with contrast enhanced liver MRI. Electronically Signed   By: Kerby Moors M.D.   On: 11/14/2018 10:21    Ct Chest Wo Contrast  Result Date: 12/13/2017 CLINICAL DATA:  Follow-up hypermetabolic pulmonary nodules. No evidence of malignancy on bronchoscopic biopsy performed 06/08/2017. Former smoker. EXAM: CT CHEST WITHOUT CONTRAST TECHNIQUE: Multidetector CT imaging of the chest was performed following the standard protocol without IV contrast. COMPARISON:  06/08/2017 chest radiograph. 04/17/2017 chest CT. 05/08/2017 PET-CT. FINDINGS: Cardiovascular: Normal heart size. No significant pericardial effusion/thickening. Great vessels are normal in course and caliber. Mediastinum/Nodes: No discrete thyroid  nodules. Unremarkable esophagus. No axillary adenopathy. Top-normal 0.8 cm right pericardiophrenic node (series 2/image 118) is stable. No pathologically enlarged mediastinal or discrete hilar nodes on this noncontrast scan. Lungs/Pleura: No pneumothorax. No pleural effusion. There are at least 5 scattered solid pulmonary nodules, all stable since 04/17/2017 chest CT, largest 1.2 cm in the right lower lobe (series 8/image 71), 1.2 cm in the medial left lower lobe (series 8/image 102), 0.9 cm in the medial right lower lobe (series 8/image 106) and 0.6 cm in the medial right middle lobe (series 8/image 86). Stable 0.7 cm ground-glass apical right upper lobe nodule (series 8/image 33). No acute consolidative airspace disease, lung masses or new significant pulmonary nodules. Stable parenchymal band in medial right middle lobe. Upper abdomen: Cholecystectomy. Stable bandlike soft tissue attenuation in the anterior right perihepatic space measuring up to 1.8 cm thickness (series 2/image 130). Musculoskeletal: No aggressive appearing focal osseous lesions. Mild thoracic spondylosis. IMPRESSION: 1. Scattered solid pulmonary nodules are all stable since 04/17/2017 chest CT. No new or enlarging pulmonary nodules. Given  hypermetabolism within these nodules on prior PET-CT, continued chest CT surveillance is warranted in 6-12 months. 2. Stable top-normal right pericardiophrenic node. No pathologically enlarged thoracic nodes. 3. Stable bandlike soft tissue attenuation in the anterior right perihepatic space, nonspecific but presumably scar tissue related to prior biloma in this location. Electronically Signed   By: Ilona Sorrel M.D.   On: 12/13/2017 12:33    I have independently reviewed the above radiology studies  and reviewed the findings with the patient.   Recent Lab Findings: Lab Results  Component Value Date   WBC 8.4 06/08/2017   HGB 15.3 (H) 06/08/2017   HCT 44.7 06/08/2017   PLT 213 06/08/2017   GLUCOSE  101 (H) 06/08/2017   ALT 25 06/08/2017   AST 23 06/08/2017   NA 140 06/08/2017   K 4.2 06/08/2017   CL 108 06/08/2017   CREATININE 1.05 (H) 06/08/2017   BUN 15 06/08/2017   CO2 20 (L) 06/08/2017   INR 0.93 06/08/2017      Assessment / Plan:   #1 no significant interval change in appearance of bilateral pulmonary nodules or right upper lobe groundglass area- not intervention indicated now - continue follow up   #2 low density area over the right lobe of liver most likely represents the area biloma after previous gallbladder surgery .  The patient's findings have been stable for at least 2 years vindicated to her this most likely implies benign disease but not 100% positive we discussed further scans , she preferred not get any further scans   we will plan to see her back as needed  Grace Isaac MD      [address] [place] [address] Office [phone]   Beeper [phone]  07/22/2020 2:25 PM

## 2021-03-16 DIAGNOSIS — M479 Spondylosis, unspecified: Secondary | ICD-10-CM | POA: Diagnosis not present

## 2021-03-16 DIAGNOSIS — S161XXD Strain of muscle, fascia and tendon at neck level, subsequent encounter: Secondary | ICD-10-CM | POA: Diagnosis not present

## 2021-03-16 DIAGNOSIS — S39012D Strain of muscle, fascia and tendon of lower back, subsequent encounter: Secondary | ICD-10-CM | POA: Diagnosis not present

## 2021-03-23 NOTE — Progress Notes (Deleted)
65 y.o. G23P3002 Married Caucasian female here for annual exam.    PCP: Hulan Fess, MD   Patient's last menstrual period was 05/01/2004 (approximate).           Sexually active: {yes no:314532}  The current method of family planning is post menopausal status.    Exercising: {yes no:314532}  {types:19826} Smoker:  no  Health Maintenance: Pap:  10/22/17 Neg:Neg HR HPV History of abnormal Pap:  Yes, Hx colposcopy and cryotherapy to cervix 1986 MMG: Breast U/S 12/16/19 Bi-rads 2 benign *** Colonoscopy:  06/04/17 f/u 10 years   BMD:  11/27/15  Result  Osteopenia of hips.   TDaP:  done with pcp  Gardasil:   n/a HIV and Hep C : possibly done in the past. Screening Labs:  Hb today: ***, Urine today: ***   reports that she quit smoking about 36 years ago. She has never used smokeless tobacco. She reports that she does not drink alcohol and does not use drugs.  Past Medical History:  Diagnosis Date   Asthma    "Exercise induced or cough variant" - very rarely use inhaler   Bile duct leak    bile drainage tube in place.   Chronic cholecystitis with calculus s/p lap chole 03/12/2015 03/22/2015   Depression    Tx'd in 07/23/1989 after a death of an infant   Dyspareunia    History of chronic cough    since gallbladder surgery-"fluid built up under right lung", "is improved"   Hypertension    Lung nodules 07/23/17   followed by Dr.Gerhardt   Pneumonia    about 10-12 years ago   Raynaud's disease    in feet   Tinnitus of right ear     Past Surgical History:  Procedure Laterality Date   BREAST SURGERY  1992/07/23   benign cyst removed right breast   CESAREAN SECTION  1991   CHOLECYSTECTOMY     8'16 laparoscopic- bile leak in abdomen, ERD visit -drain placed in to drain about 1 liter bile fluid   COLONOSCOPY     DILATION AND CURETTAGE OF UTERUS     LAPAROSCOPIC CHOLECYSTECTOMY SINGLE SITE WITH INTRAOPERATIVE CHOLANGIOGRAM  03/12/2015   Dr Johney Maine   LUNG BIOPSY N/A 06/08/2017   Procedure: LUNG  BIOPSY;  Surgeon: Grace Isaac, MD;  Location: [facility];  Service: Thoracic;  Laterality: N/A;   [place] N/A 06/08/2017   Procedure: VIDEO BRONCHOSCOPY WITH ENDOBRONCHIAL NAVIGATION;  Surgeon: Grace Isaac, MD;  Location: [facility];  Service: Thoracic;  Laterality: N/A;    Current Outpatient Medications  Medication Sig Dispense Refill   acetaminophen (TYLENOL) 325 MG tablet Take 650 mg by mouth every 6 (six) hours as needed for moderate pain (FOR PAIN.).      clobetasol ointment (TEMOVATE) 7.03 % Apply 1 application topically 2 (two) times daily. Use this for 2 weeks at a time. (Patient not taking: No sig reported) 60 g 0   EPIPEN 2-PAK 0.3 MG/0.3ML SOAJ injection Inject 0.3 mg into the muscle once as needed (allergic reaction).      losartan (COZAAR) 100 MG tablet Take 50 mg by mouth daily.      PROAIR HFA 108 (90 BASE) MCG/ACT inhaler Inhale 2 puffs into the lungs daily as needed (before exercising).      No current facility-administered medications for this visit.    Family History  Problem Relation Age of Onset   Thyroid disease Mother  hypothyroid   Hypertension Father    Thyroid disease Sister        Hashimotos    Review of Systems  Exam:   LMP 05/01/2004 (Approximate)     General appearance: alert, cooperative and appears stated age Head: normocephalic, without obvious abnormality, atraumatic Neck: no adenopathy, supple, symmetrical, trachea midline and thyroid normal to inspection and palpation Lungs: clear to auscultation bilaterally Breasts: normal appearance, no masses or tenderness, No nipple retraction or dimpling, No nipple discharge or bleeding, No axillary adenopathy Heart: regular rate and rhythm Abdomen: soft, non-tender; no masses, no organomegaly Extremities: extremities normal, atraumatic, no cyanosis or edema Skin: skin color, texture, turgor normal. No rashes or lesions Lymph nodes: cervical,  supraclavicular, and axillary nodes normal. Neurologic: grossly normal  Pelvic: External genitalia:  no lesions              No abnormal inguinal nodes palpated.              Urethra:  normal appearing urethra with no masses, tenderness or lesions              Bartholins and Skenes: normal                 Vagina: normal appearing vagina with normal color and discharge, no lesions              Cervix: no lesions              Pap taken: {yes no:314532} Bimanual Exam:  Uterus:  normal size, contour, position, consistency, mobility, non-tender              Adnexa: no mass, fullness, tenderness              Rectal exam: {yes no:314532}.  Confirms.              Anus:  normal sphincter tone, no lesions  Chaperone was present for exam:  ***  Assessment:   Well woman visit with gynecologic exam.   Plan: Mammogram screening discussed. Self breast awareness reviewed. Pap and HR HPV as above. Guidelines for Calcium, Vitamin D, regular exercise program including cardiovascular and weight bearing exercise.   Follow up annually and prn.   Additional counseling given.  {yes [id number]. _______ minutes face to face time of which over 50% was spent in counseling.    After visit summary provided.

## 2021-03-28 ENCOUNTER — Ambulatory Visit: Payer: Self-pay | Admitting: Obstetrics and Gynecology

## 2021-04-01 DIAGNOSIS — S39012D Strain of muscle, fascia and tendon of lower back, subsequent encounter: Secondary | ICD-10-CM | POA: Diagnosis not present

## 2021-04-01 DIAGNOSIS — S161XXD Strain of muscle, fascia and tendon at neck level, subsequent encounter: Secondary | ICD-10-CM | POA: Diagnosis not present

## 2021-04-01 DIAGNOSIS — M479 Spondylosis, unspecified: Secondary | ICD-10-CM | POA: Diagnosis not present

## 2021-04-04 DIAGNOSIS — S39012D Strain of muscle, fascia and tendon of lower back, subsequent encounter: Secondary | ICD-10-CM | POA: Diagnosis not present

## 2021-04-04 DIAGNOSIS — S161XXD Strain of muscle, fascia and tendon at neck level, subsequent encounter: Secondary | ICD-10-CM | POA: Diagnosis not present

## 2021-04-04 DIAGNOSIS — M479 Spondylosis, unspecified: Secondary | ICD-10-CM | POA: Diagnosis not present

## 2021-04-08 DIAGNOSIS — M479 Spondylosis, unspecified: Secondary | ICD-10-CM | POA: Diagnosis not present

## 2021-04-08 DIAGNOSIS — S161XXD Strain of muscle, fascia and tendon at neck level, subsequent encounter: Secondary | ICD-10-CM | POA: Diagnosis not present

## 2021-04-08 DIAGNOSIS — S39012D Strain of muscle, fascia and tendon of lower back, subsequent encounter: Secondary | ICD-10-CM | POA: Diagnosis not present

## 2021-04-18 DIAGNOSIS — S161XXD Strain of muscle, fascia and tendon at neck level, subsequent encounter: Secondary | ICD-10-CM | POA: Diagnosis not present

## 2021-04-18 DIAGNOSIS — M479 Spondylosis, unspecified: Secondary | ICD-10-CM | POA: Diagnosis not present

## 2021-04-18 DIAGNOSIS — S39012D Strain of muscle, fascia and tendon of lower back, subsequent encounter: Secondary | ICD-10-CM | POA: Diagnosis not present

## 2021-04-19 ENCOUNTER — Ambulatory Visit: Payer: BC Managed Care – PPO | Admitting: Obstetrics and Gynecology

## 2021-04-20 NOTE — Progress Notes (Signed)
65 y.o. G66P3002 Married Caucasian female here for breast and pelvic exam.    Vulva not causing her symptoms most of the time. Sometimes she has discomfort.   She has a history of lichenoid dermatitis.  She is evaluating exposures she has had which may be affecting this.   She has a rectocele also.  No fecal incontinence.  Feels pressure on the perineum.   Dr.Gerhardt has cleared patient to have HRT. She has benign pulmonary nodules.  She stopped her vaginal estrogen in the past while she was having evaluation with Dr. Servando Snare.   2 new granddaughters.   PCP:  Sela Hilding, MD     Patient's last menstrual period was 05/01/2004 (approximate).           Sexually active: No.  The current method of family planning is post menopausal status.    Exercising: No.  The patient does not participate in regular exercise at present. Smoker:  no  Health Maintenance: Pap:   10/22/17 Neg:Neg HR HPV, 10-09-13 Neg:Neg HR HPV, 10-19-10 Neg History of abnormal Pap:  yes,  Hx colposcopy and cryotherapy to cervix 1986 MMG:  11/2020 normal with [facility]--call for report Colonoscopy:  06/04/17 f/u 10 years   BMD:  Pt.states 08-09-2020 Result :Osteopenia of hips TDaP:  PCP Gardasil:   no HIV: Neg in the past Hep C: Unsure Screening Labs:  PCP   reports that she quit smoking about 37 years ago. She has never used smokeless tobacco. She reports that she does not drink alcohol and does not use drugs.  Past Medical History:  Diagnosis Date   Asthma    "Exercise induced or cough variant" - very rarely use inhaler   Bile duct leak    bile drainage tube in place.   Chronic cholecystitis with calculus s/p lap chole 03/12/2015 03/22/2015   Depression    Tx'd in 09-Aug-1989 after a death of an infant   Dyspareunia    History of chronic cough    since gallbladder surgery-"fluid built up under right lung", "is improved"   Hypertension    Lung nodules 08-09-17   followed by Dr.Gerhardt   Pneumonia    about 10-12  years ago   Raynaud's disease    in feet   Tinnitus of right ear     Past Surgical History:  Procedure Laterality Date   BREAST SURGERY  August 09, 1992   benign cyst removed right breast   CESAREAN SECTION  1991   CHOLECYSTECTOMY     8'16 laparoscopic- bile leak in abdomen, ERD visit -drain placed in to drain about 1 liter bile fluid   COLONOSCOPY     DILATION AND CURETTAGE OF UTERUS     LAPAROSCOPIC CHOLECYSTECTOMY SINGLE SITE WITH INTRAOPERATIVE CHOLANGIOGRAM  03/12/2015   Dr Johney Maine   LUNG BIOPSY N/A 06/08/2017   Procedure: LUNG BIOPSY;  Surgeon: Grace Isaac, MD;  Location: [facility];  Service: Thoracic;  Laterality: N/A;   [place] N/A 06/08/2017   Procedure: VIDEO BRONCHOSCOPY WITH ENDOBRONCHIAL NAVIGATION;  Surgeon: Grace Isaac, MD;  Location: [facility];  Service: Thoracic;  Laterality: N/A;    Current Outpatient Medications  Medication Sig Dispense Refill   estradiol (ESTRACE) 0.1 MG/GM vaginal cream Use 1/2 g vaginally every night for the first 2 weeks, then use 1/2 g vaginally two or three times per week as needed to maintain symptom relief. 42.5 g 1   acetaminophen (TYLENOL) 325 MG tablet Take 650 mg by mouth every 6 (  six) hours as needed for moderate pain (FOR PAIN.).      clobetasol ointment (TEMOVATE) 0.99 % Apply 1 application topically 2 (two) times daily. Use this for 2 weeks at a time for a flare.  You may use this topically twice a week at bedtime for maintenance dosing. 60 g 1   EPIPEN 2-PAK 0.3 MG/0.3ML SOAJ injection Inject 0.3 mg into the muscle once as needed (allergic reaction).      losartan (COZAAR) 100 MG tablet Take 50 mg by mouth daily.      PROAIR HFA 108 (90 BASE) MCG/ACT inhaler Inhale 2 puffs into the lungs daily as needed (before exercising).      No current facility-administered medications for this visit.    Family History  Problem Relation Age of Onset   Thyroid disease Mother        hypothyroid   Hypertension  Father    Thyroid disease Sister        Hashimotos    Review of Systems  Genitourinary:  Positive for vaginal pain (vulvar irritation).  All other systems reviewed and are negative.  Exam:   BP (!) 146/84    Pulse 83    Ht 5' 6.5" (1.689 m)    LMP 05/01/2004 (Approximate)    SpO2 100%    BMI 29.41 kg/m     General appearance: alert, cooperative and appears stated age Head: normocephalic, without obvious abnormality, atraumatic Neck: no adenopathy, supple, symmetrical, trachea midline and thyroid normal to inspection and palpation Lungs: clear to auscultation bilaterally Breasts: normal appearance, no masses or tenderness, No nipple retraction or dimpling, No nipple discharge or bleeding, No axillary adenopathy Heart: regular rate and rhythm Abdomen: soft, non-tender; no masses, no organomegaly Extremities: extremities normal, atraumatic, no cyanosis or edema Skin: skin color, texture, turgor normal. No rashes or lesions Lymph nodes: cervical, supraclavicular, and axillary nodes normal. Neurologic: grossly normal  Pelvic: External genitalia:  right introitus with erythema and ulcerative appearance.  Left side with less erthema.              No abnormal inguinal nodes palpated.              Urethra:  normal appearing urethra with no masses, tenderness or lesions              Bartholins and Skenes: normal                 Vagina: generalized atrophy noted.  First degree rectocele.               Cervix: no lesions              Pap taken: yes Bimanual Exam:  Uterus:  normal size, contour, position, consistency, mobility, non-tender              Adnexa: no mass, fullness, tenderness              Rectal exam: yes.  Confirms.              Anus:  normal sphincter tone, no lesions  Chaperone was present for exam:  Estill Bamberg, CMA  Assessment:   Well woman visit with gynecologic exam. Lichenoid dermatitis.   Looks like lichen planus.  Vaginal atrophy. Rectocele.   Plan: Mammogram  screening discussed.  Will get a copy of last mammogram.  Self breast awareness reviewed. Pap and HR HPV as above. Guidelines for Calcium, Vitamin D, regular exercise program including cardiovascular and weight bearing exercise. We  discussed lichen planus and vaginal atrophy.  Rx for clobetasol ointment.  Instructed in use.  Rx for Estrace cream.  Instructed in use.  We also discussed her rectocele and treatment options of physical therapy, pessary, and surgical care.    Follow up annually and prn.   After visit summary provided.   32 min  total time was spent for this patient encounter, including preparation, face-to-face counseling with the patient, coordination of care, and documentation of the encounter.

## 2021-05-03 ENCOUNTER — Ambulatory Visit (INDEPENDENT_AMBULATORY_CARE_PROVIDER_SITE_OTHER): Payer: Medicare PPO | Admitting: Obstetrics and Gynecology

## 2021-05-03 ENCOUNTER — Other Ambulatory Visit: Payer: Self-pay

## 2021-05-03 ENCOUNTER — Other Ambulatory Visit (HOSPITAL_COMMUNITY)
Admission: RE | Admit: 2021-05-03 | Discharge: 2021-05-03 | Disposition: A | Discharge status: Home / Self Care | Payer: Medicare PPO | Source: Ambulatory Visit | Attending: Obstetrics and Gynecology | Admitting: Obstetrics and Gynecology

## 2021-05-03 ENCOUNTER — Encounter: Payer: Self-pay | Admitting: Obstetrics and Gynecology

## 2021-05-03 VITALS — BP 146/84 | HR 83 | Ht 66.5 in

## 2021-05-03 DIAGNOSIS — Z01419 Encounter for gynecological examination (general) (routine) without abnormal findings: Secondary | ICD-10-CM | POA: Diagnosis not present

## 2021-05-03 DIAGNOSIS — L280 Lichen simplex chronicus: Secondary | ICD-10-CM

## 2021-05-03 DIAGNOSIS — Z124 Encounter for screening for malignant neoplasm of cervix: Secondary | ICD-10-CM

## 2021-05-03 DIAGNOSIS — N952 Postmenopausal atrophic vaginitis: Secondary | ICD-10-CM

## 2021-05-03 DIAGNOSIS — N816 Rectocele: Secondary | ICD-10-CM | POA: Diagnosis not present

## 2021-05-03 MED ORDER — ESTRADIOL 0.1 MG/GM VA CREA: TOPICAL_CREAM | VAGINAL | 1 refills | Status: DC

## 2021-05-03 MED ORDER — CLOBETASOL PROPIONATE 0.05 % EX OINT: 1.0000 "application " | TOPICAL_OINTMENT | Freq: Two times a day (BID) | CUTANEOUS | 1 refills | Status: DC

## 2021-05-03 NOTE — Patient Instructions (Signed)
EXERCISE AND DIET:  We recommended that you start or continue a regular exercise program for good health. Regular exercise means any activity that makes your heart beat faster and makes you sweat.  We recommend exercising at least 30 minutes per day at least 3 days a week, preferably 4 or 5.  We also recommend a diet low in fat and sugar.  Inactivity, poor dietary choices and obesity can cause diabetes, heart attack, stroke, and kidney damage, among others.   ° °ALCOHOL AND SMOKING:  Women should limit their alcohol intake to no more than 7 drinks/beers/glasses of wine (combined, not each!) per week. Moderation of alcohol intake to this level decreases your risk of breast cancer and liver damage. And of course, no recreational drugs are part of a healthy lifestyle.  And absolutely no smoking or even second hand smoke. Most people know smoking can cause heart and lung diseases, but did you know it also contributes to weakening of your bones? Aging of your skin?  Yellowing of your teeth and nails? ° °CALCIUM AND VITAMIN D:  Adequate intake of calcium and Vitamin D are recommended.  The recommendations for exact amounts of these supplements seem to change often, but generally speaking 600 mg of calcium (either carbonate or citrate) and 800 units of Vitamin D per day seems prudent. Certain women may benefit from higher intake of Vitamin D.  If you are among these women, your doctor will have told you during your visit.   ° °PAP SMEARS:  Pap smears, to check for cervical cancer or precancers,  have traditionally been done yearly, although recent scientific advances have shown that most women can have pap smears less often.  However, every woman still should have a physical exam from her gynecologist every year. It will include a breast check, inspection of the vulva and vagina to check for abnormal growths or skin changes, a visual exam of the cervix, and then an exam to evaluate the size and shape of the uterus and  ovaries.  And after 68 years of age, a rectal exam is indicated to check for rectal cancers. We will also provide age appropriate advice regarding health maintenance, like when you should have certain vaccines, screening for sexually transmitted diseases, bone density testing, colonoscopy, mammograms, etc.  ° °MAMMOGRAMS:  All women over 40 years old should have a yearly mammogram. Many facilities now offer a "3D" mammogram, which may cost around $50 extra out of pocket. If possible,  we recommend you accept the option to have the 3D mammogram performed.  It both reduces the number of women who will be called back for extra views which then turn out to be normal, and it is better than the routine mammogram at detecting truly abnormal areas.   ° °COLONOSCOPY:  Colonoscopy to screen for colon cancer is recommended for all women at age 50.  We know, you hate the idea of the prep.  We agree, BUT, having colon cancer and not knowing it is worse!!  Colon cancer so often starts as a polyp that can be seen and removed at colonscopy, which can quite literally save your life!  And if your first colonoscopy is normal and you have no family history of colon cancer, most women don't have to have it again for 10 years.  Once every ten years, you can do something that may end up saving your life, right?  We will be happy to help you get it scheduled when you are ready.    Be sure to check your insurance coverage so you understand how much it will cost.  It may be covered as a preventative service at no cost, but you should check your particular policy.      Lichen Planus Lichen planus is a skin problem that causes redness, itching, small bumps, and sores. It can affect the skin in any area of the body. Some common areas affected include: Arms, wrists, legs, or ankles. Chest, back, or abdomen. Genital areas such as the vulva and vagina. Gums and inside of the mouth. Scalp. Fingernails or toenails. Treatment can help  control the symptoms of this condition. The condition can last for a long time. It can take 6-18 months or longer for it to go away. What are the causes? The exact cause of this condition is not known. The condition is not passed from one person to another (not contagious). It may be related to an allergy, a medicine, or an autoimmune response. An autoimmune response occurs when the body's defense system (immune system) mistakenly attacks healthy tissues. What increases the risk? The following factors may make you more likely to develop this condition: Being older than 68 years of age. Taking certain medicines. Having been exposed to certain dyes or chemicals. Having hepatitis C. Being a woman. What are the signs or symptoms? Symptoms of this condition may include: Itching, which can be severe. Small reddish or purple bumps on the skin. These may have flat tops and may be round or irregular shaped. Redness or white patches on the gums or tongue. Redness, soreness, or a burning feeling in the genital area. This may lead to pain or bleeding during sex. Changes in the fingernails or toenails. The nails may become thin or rough. They may have ridges in them. Redness or irritation of the eyes. This is rare. How is this diagnosed? This condition may be diagnosed based on: A physical exam. The health care provider will examine your affected skin and check for changes inside your mouth. Removal of a tissue sample (biopsy sample) to be looked at under a microscope. How is this treated? Treatment for this condition may depend on the severity of symptoms. In some cases, no treatment is needed. If treatment is needed to control symptoms, it may include: Creams or ointments (topical steroids) to help control itching and irritation. Medicine to be taken by mouth. Medicine to be taken by injection. A treatment in which your skin is exposed to ultraviolet light (phototherapy). Lozenges that you suck on  to help treat sores in the mouth. Follow these instructions at home:  Take or use over-the-counter and prescription medicines only as told by your health care provider. Use creams or ointments as told by your health care provider. Do not scratch the affected areas of skin. If you are a woman, be sure to keep the vaginal area as clean and dry as possible. If you have sores in your mouth, avoid spicy and acidic foods as well as alcohol and tobacco. Keep all follow-up visits as told by your health care provider. This is important. Contact a health care provider if: You have increasing redness, swelling, or pain in the affected area. You have fluid, blood, or pus coming from the affected area. Your eyes become irritated. Summary Lichen planus is a skin problem that causes redness, itching, small bumps, and sores. It can affect the skin in any area of the body. Do not scratch the affected areas of skin. Keep the affected area of skin  clean. Take or use over-the-counter and prescription medicines only as told by your health care provider. Contact a health care provider if you have drainage from the affected area or have increasing redness, swelling, or pain in the area. Keep all follow-up visits as told by your health care provider. This is important. This information is not intended to replace advice given to you by your health care provider. Make sure you discuss any questions you have with your health care provider. Document Revised: 08/29/2017 Document Reviewed: 08/29/2017 Elsevier Patient Education  [employer].

## 2021-05-04 ENCOUNTER — Encounter: Payer: Self-pay | Admitting: Obstetrics and Gynecology

## 2021-05-04 LAB — CYTOLOGY - PAP: Diagnosis: NEGATIVE

## 2021-05-08 ENCOUNTER — Encounter: Payer: Self-pay | Admitting: Obstetrics and Gynecology

## 2021-07-18 DIAGNOSIS — Z23 Encounter for immunization: Secondary | ICD-10-CM | POA: Diagnosis not present

## 2021-07-18 DIAGNOSIS — Z0000 Encounter for general adult medical examination without abnormal findings: Secondary | ICD-10-CM | POA: Diagnosis not present

## 2021-07-18 DIAGNOSIS — Z91038 Other insect allergy status: Secondary | ICD-10-CM | POA: Diagnosis not present

## 2021-07-18 DIAGNOSIS — R944 Abnormal results of kidney function studies: Secondary | ICD-10-CM | POA: Diagnosis not present

## 2021-07-18 DIAGNOSIS — M8580 Other specified disorders of bone density and structure, unspecified site: Secondary | ICD-10-CM | POA: Diagnosis not present

## 2021-07-18 DIAGNOSIS — I10 Essential (primary) hypertension: Secondary | ICD-10-CM | POA: Diagnosis not present

## 2021-10-24 DIAGNOSIS — H35373 Puckering of macula, bilateral: Secondary | ICD-10-CM | POA: Diagnosis not present

## 2021-10-24 DIAGNOSIS — H5203 Hypermetropia, bilateral: Secondary | ICD-10-CM | POA: Diagnosis not present

## 2021-10-24 DIAGNOSIS — H2513 Age-related nuclear cataract, bilateral: Secondary | ICD-10-CM | POA: Diagnosis not present

## 2022-01-12 DIAGNOSIS — Z23 Encounter for immunization: Secondary | ICD-10-CM | POA: Diagnosis not present

## 2022-01-16 DIAGNOSIS — Z1231 Encounter for screening mammogram for malignant neoplasm of breast: Secondary | ICD-10-CM | POA: Diagnosis not present

## 2022-01-17 ENCOUNTER — Encounter: Payer: Self-pay | Admitting: Obstetrics and Gynecology

## 2022-03-09 DIAGNOSIS — M85852 Other specified disorders of bone density and structure, left thigh: Secondary | ICD-10-CM | POA: Diagnosis not present

## 2022-03-09 DIAGNOSIS — M85851 Other specified disorders of bone density and structure, right thigh: Secondary | ICD-10-CM | POA: Diagnosis not present

## 2022-03-10 ENCOUNTER — Encounter: Payer: Self-pay | Admitting: Obstetrics and Gynecology

## 2022-04-07 ENCOUNTER — Telehealth: Payer: Self-pay | Admitting: *Deleted

## 2022-04-07 NOTE — Patient Outreach (Signed)
  Care Coordination   04/07/2022 Name: PURA PICINICH MRN: [id number] DOB: 07/18/1955   Care Coordination Outreach Attempts:  An unsuccessful telephone outreach was attempted today to offer the patient information about available care coordination services as a benefit of their health plan.   Follow Up Plan:  Additional outreach attempts will be made to offer the patient care coordination information and services.   Encounter Outcome:  No Answer   Care Coordination Interventions:  No, not indicated    Raina Mina, RN Care Management Coordinator [place] [facility] [phone]

## 2022-04-19 NOTE — Progress Notes (Deleted)
GYNECOLOGY  VISIT   HPI: 66 y.o.   Married  Caucasian  female   [phone] with Patient's last menstrual period was 05/01/2004 (approximate).   here for   1 year med check  GYNECOLOGIC HISTORY: Patient's last menstrual period was 05/01/2004 (approximate). Contraception:  post menopausal status Menopausal hormone therapy:  n/a Last mammogram:  01/17/22, Breast Composition Category B, BI-RADS CATEGORY 1 Negative Last pap smear:   05/03/21 negative, 10/22/17 negative        OB History     Gravida  3   Para  3   Term  3   Preterm  0   AB  0   Living  2      SAB  0   IAB  0   Ectopic  0   Multiple  0   Live Births  3              Patient Active Problem List   Diagnosis Date Noted   Chronic vulvitis 10/22/2017   Intra-abdominal fluid collection s/p perc drainage 03/23/2015 03/23/2015   Chronic cholecystitis with calculus s/p lap chole 03/12/2015 03/22/2015   Hypertension     Past Medical History:  Diagnosis Date   Asthma    "Exercise induced or cough variant" - very rarely use inhaler   Bile duct leak    bile drainage tube in place.   Chronic cholecystitis with calculus s/p lap chole 03/12/2015 03/22/2015   Depression    Tx'd in Jul 26, 1989 after a death of an infant   Dyspareunia    History of chronic cough    since gallbladder surgery-"fluid built up under right lung", "is improved"   Hypertension    Lung nodules 07-26-2017   followed by Dr.Gerhardt   Osteopenia    Pneumonia    about 10-12 years ago   Raynaud's disease    in feet   Tinnitus of right ear     Past Surgical History:  Procedure Laterality Date   BREAST SURGERY  Jul 26, 1992   benign cyst removed right breast   CESAREAN SECTION  1991   CHOLECYSTECTOMY     8'16 laparoscopic- bile leak in abdomen, ERD visit -drain placed in to drain about 1 liter bile fluid   COLONOSCOPY     DILATION AND CURETTAGE OF UTERUS     LAPAROSCOPIC CHOLECYSTECTOMY SINGLE SITE WITH INTRAOPERATIVE CHOLANGIOGRAM  03/12/2015    Dr Johney Maine   LUNG BIOPSY N/A 06/08/2017   Procedure: LUNG BIOPSY;  Surgeon: Grace Isaac, MD;  Location: [facility];  Service: Thoracic;  Laterality: N/A;   VIDEO BRONCHOSCOPY WITH ENDOBRONCHIAL NAVIGATION N/A 06/08/2017   Procedure: VIDEO BRONCHOSCOPY WITH ENDOBRONCHIAL NAVIGATION;  Surgeon: Grace Isaac, MD;  Location: [facility];  Service: Thoracic;  Laterality: N/A;    Current Outpatient Medications  Medication Sig Dispense Refill   acetaminophen (TYLENOL) 325 MG tablet Take 650 mg by mouth every 6 (six) hours as needed for moderate pain (FOR PAIN.).      clobetasol ointment (TEMOVATE) 1.65 % Apply 1 application topically 2 (two) times daily. Use this for 2 weeks at a time for a flare.  You may use this topically twice a week at bedtime for maintenance dosing. 60 g 1   EPIPEN 2-PAK 0.3 MG/0.3ML SOAJ injection Inject 0.3 mg into the muscle once as needed (allergic reaction).      estradiol (ESTRACE) 0.1 MG/GM vaginal cream Use 1/2 g vaginally every night for the first 2 weeks, then use 1/2 g vaginally  two or three times per week as needed to maintain symptom relief. 42.5 g 1   losartan (COZAAR) 100 MG tablet Take 50 mg by mouth daily.      PROAIR HFA 108 (90 BASE) MCG/ACT inhaler Inhale 2 puffs into the lungs daily as needed (before exercising).      No current facility-administered medications for this visit.     ALLERGIES: Nsaids, Bee venom, Hibiclens [chlorhexidine], and Singulair [montelukast]  Family History  Problem Relation Age of Onset   Thyroid disease Mother        hypothyroid   Hypertension Father    Thyroid disease Sister        Hashimotos    Social History   Socioeconomic History   Marital status: Married    Spouse name: Not on file   Number of children: Not on file   Years of education: Not on file   Highest education level: Not on file  Occupational History   Not on file  Tobacco Use   Smoking status: Former    Types: Cigarettes    Quit date: 05/01/1984     Years since quitting: 37.9   Smokeless tobacco: Never  Vaping Use   Vaping Use: Never used  Substance and Sexual Activity   Alcohol use: No    Alcohol/week: 1.0 standard drink of alcohol    Types: 1 Standard drinks or equivalent per week    Comment: occasionally 3-4 times a month   Drug use: No   Sexual activity: Not Currently    Partners: Male    Birth control/protection: Post-menopausal  Other Topics Concern   Not on file  Social History Narrative   Not on file   Social Determinants of Health   Financial Resource Strain: Not on file  Food Insecurity: Not on file  Transportation Needs: Not on file  Physical Activity: Not on file  Stress: Not on file  Social Connections: Not on file  Intimate Partner Violence: Not on file    Review of Systems  PHYSICAL EXAMINATION:    LMP 05/01/2004 (Approximate)     General appearance: alert, cooperative and appears stated age Head: Normocephalic, without obvious abnormality, atraumatic Neck: no adenopathy, supple, symmetrical, trachea midline and thyroid normal to inspection and palpation Lungs: clear to auscultation bilaterally Breasts: normal appearance, no masses or tenderness, No nipple retraction or dimpling, No nipple discharge or bleeding, No axillary or supraclavicular adenopathy Heart: regular rate and rhythm Abdomen: soft, non-tender, no masses,  no organomegaly Extremities: extremities normal, atraumatic, no cyanosis or edema Skin: Skin color, texture, turgor normal. No rashes or lesions Lymph nodes: Cervical, supraclavicular, and axillary nodes normal. No abnormal inguinal nodes palpated Neurologic: Grossly normal  Pelvic: External genitalia:  no lesions              Urethra:  normal appearing urethra with no masses, tenderness or lesions              Bartholins and Skenes: normal                 Vagina: normal appearing vagina with normal color and discharge, no lesions              Cervix: no lesions                 Bimanual Exam:  Uterus:  normal size, contour, position, consistency, mobility, non-tender              Adnexa: no mass, fullness, tenderness  Rectal exam: {yes no:314532}.  Confirms.              Anus:  normal sphincter tone, no lesions  Chaperone was present for exam:  ***  ASSESSMENT     PLAN     An After Visit Summary was printed and given to the patient.  ______ minutes face to face time of which over 50% was spent in counseling.

## 2022-05-03 ENCOUNTER — Ambulatory Visit: Payer: Medicare PPO | Admitting: Obstetrics and Gynecology

## 2022-05-04 NOTE — Progress Notes (Deleted)
67 y.o. G56P3002 Married Caucasian female here for breast and pelvic.    PCP:     Patient's last menstrual period was 05/01/2004 (approximate).           Sexually active: {yes no:314532}  The current method of family planning is post menopausal status.    Exercising: {yes no:314532}  {types:19826} Smoker:  {YES NO:22349}   Health Maintenance: Pap:  05/03/21 Neg:Neg HR HPV, 10/22/17 Neg:Neg HR HPV, 10-09-13 Neg:Neg HR HPV, 10-19-10 Neg History of abnormal Pap:  yes,  Hx colposcopy and cryotherapy to cervix 1986 MMG:  01/17/22 Breast Density Category B, BI-RADS CATEGORY 1 Negative Colonoscopy:  06/04/17 f/u 10 years   BMD:  Pt.states 2020-07-27 Result :Osteopenia of hips TDaP:  PCP Gardasil:   no HIV: Neg in the past Hep C: Unsure Screening Labs:   Hb today: ***, Urine today: ***   reports that she quit smoking about 38 years ago. Her smoking use included cigarettes. She has never used smokeless tobacco. She reports that she does not drink alcohol and does not use drugs.  Past Medical History:  Diagnosis Date   Asthma    "Exercise induced or cough variant" - very rarely use inhaler   Bile duct leak    bile drainage tube in place.   Chronic cholecystitis with calculus s/p lap chole 03/12/2015 03/22/2015   Depression    Tx'd in 07/27/1989 after a death of an infant   Dyspareunia    History of chronic cough    since gallbladder surgery-"fluid built up under right lung", "is improved"   Hypertension    Lung nodules Jul 27, 2017   followed by Dr.Gerhardt   Osteopenia    Pneumonia    about 10-12 years ago   Raynaud's disease    in feet   Tinnitus of right ear     Past Surgical History:  Procedure Laterality Date   BREAST SURGERY  27-Jul-1992   benign cyst removed right breast   CESAREAN SECTION  1991   CHOLECYSTECTOMY     8'16 laparoscopic- bile leak in abdomen, ERD visit -drain placed in to drain about 1 liter bile fluid   COLONOSCOPY     DILATION AND CURETTAGE OF UTERUS     LAPAROSCOPIC  CHOLECYSTECTOMY SINGLE SITE WITH INTRAOPERATIVE CHOLANGIOGRAM  03/12/2015   Dr Johney Maine   LUNG BIOPSY N/A 06/08/2017   Procedure: LUNG BIOPSY;  Surgeon: Grace Isaac, MD;  Location: [facility];  Service: Thoracic;  Laterality: N/A;   VIDEO BRONCHOSCOPY WITH ENDOBRONCHIAL NAVIGATION N/A 06/08/2017   Procedure: VIDEO BRONCHOSCOPY WITH ENDOBRONCHIAL NAVIGATION;  Surgeon: Grace Isaac, MD;  Location: [facility];  Service: Thoracic;  Laterality: N/A;    Current Outpatient Medications  Medication Sig Dispense Refill   acetaminophen (TYLENOL) 325 MG tablet Take 650 mg by mouth every 6 (six) hours as needed for moderate pain (FOR PAIN.).      clobetasol ointment (TEMOVATE) [id number] % Apply 1 application topically 2 (two) times daily. Use this for 2 weeks at a time for a flare.  You may use this topically twice a week at bedtime for maintenance dosing. 60 g 1   EPIPEN 2-PAK 0.3 MG/0.3ML SOAJ injection Inject 0.3 mg into the muscle once as needed (allergic reaction).      estradiol (ESTRACE) 0.1 MG/GM vaginal cream Use 1/2 g vaginally every night for the first 2 weeks, then use 1/2 g vaginally two or three times per week as needed to maintain symptom relief. 42.5 g 1  losartan (COZAAR) 100 MG tablet Take 50 mg by mouth daily.      PROAIR HFA 108 (90 BASE) MCG/ACT inhaler Inhale 2 puffs into the lungs daily as needed (before exercising).      No current facility-administered medications for this visit.    Family History  Problem Relation Age of Onset   Thyroid disease Mother        hypothyroid   Hypertension Father    Thyroid disease Sister        Hashimotos    Review of Systems  Exam:   LMP 05/01/2004 (Approximate)     General appearance: alert, cooperative and appears stated age Head: normocephalic, without obvious abnormality, atraumatic Neck: no adenopathy, supple, symmetrical, trachea midline and thyroid normal to inspection and palpation Lungs: clear to auscultation bilaterally Breasts:  normal appearance, no masses or tenderness, No nipple retraction or dimpling, No nipple discharge or bleeding, No axillary adenopathy Heart: regular rate and rhythm Abdomen: soft, non-tender; no masses, no organomegaly Extremities: extremities normal, atraumatic, no cyanosis or edema Skin: skin color, texture, turgor normal. No rashes or lesions Lymph nodes: cervical, supraclavicular, and axillary nodes normal. Neurologic: grossly normal  Pelvic: External genitalia:  no lesions              No abnormal inguinal nodes palpated.              Urethra:  normal appearing urethra with no masses, tenderness or lesions              Bartholins and Skenes: normal                 Vagina: normal appearing vagina with normal color and discharge, no lesions              Cervix: no lesions              Pap taken: {yes no:314532} Bimanual Exam:  Uterus:  normal size, contour, position, consistency, mobility, non-tender              Adnexa: no mass, fullness, tenderness              Rectal exam: {yes no:314532}.  Confirms.              Anus:  normal sphincter tone, no lesions  Chaperone was present for exam:  ***  Assessment:   Well woman visit with gynecologic exam.   Plan: Mammogram screening discussed. Self breast awareness reviewed. Pap and HR HPV as above. Guidelines for Calcium, Vitamin D, regular exercise program including cardiovascular and weight bearing exercise.   Follow up annually and prn.   Additional counseling given.  {yes [id number]. _______ minutes face to face time of which over 50% was spent in counseling.    After visit summary provided.

## 2022-05-16 ENCOUNTER — Ambulatory Visit: Payer: Medicare PPO | Admitting: Obstetrics and Gynecology

## 2022-06-12 ENCOUNTER — Ambulatory Visit: Payer: Medicare PPO | Admitting: Obstetrics and Gynecology

## 2022-06-13 NOTE — Progress Notes (Signed)
67 y.o. G64P3002 Married Caucasian female here for 1 yr meds f/u.    She is followed for lichenoid dermatitis of the vuva, first degree rectocele, and vaginal atrophy. She is treated with clobetasol ointment and estradiol vaginal cream.  The clobetasol ointment is really thick and hard to use.   Father passed in July.  Daughter and grand daughter have moved in.  Helping care for another grand daughter as well.  Husband and mother have health issues.   Had counseling in past when her son died.  PCP: Sela Hilding, M.D  Patient's last menstrual period was 05/01/2004 (approximate).           Sexually active: No.  The current method of family planning is post menopausal status.    Exercising: Yes.     Some walking Smoker:  former  Health Maintenance: Pap:  05/03/21 neg, 10/22/17 Neg:Neg HR HPV, 10-09-13 Neg:Neg HR HPV  History of abnormal Pap:  yes,  Hx colposcopy and cryotherapy to cervix 1986  MMG:  01/17/22 Breast Density Category B, BI-RADS CATEGORY 1 Neg Colonoscopy:  06-04-17 BMD:   03-09-2022  Result  f/u 29yr TDaP:  PCP Gardasil:   no HIV: neg per patient Hep C: neg per patient Screening Labs:  PCP   reports that she quit smoking about 38 years ago. Her smoking use included cigarettes. She has never used smokeless tobacco. She reports that she does not drink alcohol and does not use drugs.  Past Medical History:  Diagnosis Date   Asthma    "Exercise induced or cough variant" - very rarely use inhaler   Bile duct leak    bile drainage tube in place.   Chronic cholecystitis with calculus s/p lap chole 03/12/2015 03/22/2015   Depression    Tx'd in 104-02-91after a death of an infant   Dyspareunia    History of chronic cough    since gallbladder surgery-"fluid built up under right lung", "is improved"   Hypertension    Lung nodules 004/06/2017  followed by Dr.Gerhardt   Osteopenia    Pneumonia    about 10-12 years ago   Raynaud's disease    in feet   Tinnitus of right  ear     Past Surgical History:  Procedure Laterality Date   BREAST SURGERY  104/06/1992  benign cyst removed right breast   CESAREAN SECTION  1991   CHOLECYSTECTOMY     8'16 laparoscopic- bile leak in abdomen, ERD visit -drain placed in to drain about 1 liter bile fluid   COLONOSCOPY     DILATION AND CURETTAGE OF UTERUS     LAPAROSCOPIC CHOLECYSTECTOMY SINGLE SITE WITH INTRAOPERATIVE CHOLANGIOGRAM  03/12/2015   Dr GJohney Maine  LUNG BIOPSY N/A 06/08/2017   Procedure: LUNG BIOPSY;  Surgeon: GGrace Isaac MD;  Location: [facility]  Service: Thoracic;  Laterality: N/A;   VIDEO BRONCHOSCOPY WITH ENDOBRONCHIAL NAVIGATION N/A 06/08/2017   Procedure: VIDEO BRONCHOSCOPY WITH ENDOBRONCHIAL NAVIGATION;  Surgeon: GGrace Isaac MD;  Location: [facility];  Service: Thoracic;  Laterality: N/A;    Current Outpatient Medications  Medication Sig Dispense Refill   acetaminophen (TYLENOL) 325 MG tablet Take 650 mg by mouth every 6 (six) hours as needed for moderate pain (FOR PAIN.).      Cholecalciferol (VITAMIN D3 PO) Take by mouth.     losartan (COZAAR) 100 MG tablet Take 50 mg by mouth daily.      METROGEL 1 % gel 1 application a thin layer to  affected area Externally Once a day for 30 days     PROAIR HFA 108 (90 BASE) MCG/ACT inhaler Inhale 2 puffs into the lungs daily as needed (before exercising).      clobetasol ointment (TEMOVATE) [id number] % Apply 1 application topically 2 (two) times daily. Use this for 2 weeks at a time for a flare.  You may use this topically twice a week at bedtime for maintenance dosing. (Patient not taking: Reported on 06/27/2022) 60 g 1   EPIPEN 2-PAK 0.3 MG/0.3ML SOAJ injection Inject 0.3 mg into the muscle once as needed (allergic reaction).  (Patient not taking: Reported on 06/27/2022)     estradiol (ESTRACE) 0.1 MG/GM vaginal cream Use 1/2 g vaginally every night for the first 2 weeks, then use 1/2 g vaginally two or three times per week as needed to maintain symptom relief. (Patient not  taking: Reported on 06/27/2022) 42.5 g 1   No current facility-administered medications for this visit.    Family History  Problem Relation Age of Onset   Thyroid disease Mother        hypothyroid   Hypertension Father    Parkinson's disease Father    Thyroid disease Sister        Hashimotos    Review of Systems  Constitutional: Negative.   HENT: Negative.    Eyes: Negative.   Respiratory: Negative.    Cardiovascular: Negative.   Gastrointestinal: Negative.   Endocrine: Negative.   Genitourinary: Negative.   Musculoskeletal: Negative.   Skin: Negative.   Allergic/Immunologic: Negative.   Neurological: Negative.   Hematological: Negative.   Psychiatric/Behavioral: Negative.      Exam:   BP 120/78   Pulse 71   Ht 5' 6.75" (1.695 m)   LMP 05/01/2004 (Approximate)   SpO2 97%   BMI 29.19 kg/m     General appearance: alert, cooperative and appears stated age Head: normocephalic, without obvious abnormality, atraumatic Neck: no adenopathy, supple, symmetrical, trachea midline and thyroid normal to inspection and palpation Lungs: clear to auscultation bilaterally Breasts: normal appearance, no masses or tenderness, No nipple retraction or dimpling, No nipple discharge or bleeding, No axillary adenopathy Heart: regular rate and rhythm Abdomen: soft, non-tender; no masses, no organomegaly Extremities: extremities normal, atraumatic, no cyanosis or edema Skin: skin color, texture, turgor normal. No rashes or lesions Lymph nodes: cervical, supraclavicular, and axillary nodes normal. Neurologic: grossly normal  Pelvic: External genitalia:  beefy red erythema of the posterior introitus with thickened acetowhite change at 5:00.               No abnormal inguinal nodes palpated.              Urethra:  normal appearing urethra with no masses, tenderness or lesions              Bartholins and Skenes: normal                 Vagina: normal appearing vagina with normal color and  discharge, no lesions              Cervix: no lesions              Pap taken: no Bimanual Exam:  Uterus:  normal size, contour, position, consistency, mobility, non-tender              Adnexa: no mass, fullness, tenderness              Chaperone was present for exam:  Raquel Sarna  Assessment:  Vaginal atrophy.  Chronic vulvitis.  Prior biopsy showing lichenoid dermatitis.  Vulvar lesion. Encounter for medication monitoring.  Life stress.   Plan: Mammogram screening discussed. Self breast awareness reviewed. Pap not indicated.  Refill of vaginal estrogen cream.  I did discuss potential effect on precancer or cancer of the breast.  Switch to Clobetasol cream 0.05%.  instructed in use.  Return for vulvar biopsy.  Support given for the life stress.  We discussed potential counseling options.  Fu in 1 year for breast/pelvic and medication monitoring visit.    Follow up annually and prn.   After visit summary provided.   34 min  total time was spent for this patient encounter, including preparation, face-to-face counseling with the patient, coordination of care, and documentation of the encounter.

## 2022-06-27 ENCOUNTER — Encounter: Payer: Self-pay | Admitting: Obstetrics and Gynecology

## 2022-06-27 ENCOUNTER — Ambulatory Visit: Payer: Medicare PPO | Admitting: Obstetrics and Gynecology

## 2022-06-27 VITALS — BP 120/78 | HR 71 | Ht 66.75 in

## 2022-06-27 DIAGNOSIS — Z5181 Encounter for therapeutic drug level monitoring: Secondary | ICD-10-CM

## 2022-06-27 DIAGNOSIS — N9089 Other specified noninflammatory disorders of vulva and perineum: Secondary | ICD-10-CM

## 2022-06-27 DIAGNOSIS — N952 Postmenopausal atrophic vaginitis: Secondary | ICD-10-CM | POA: Diagnosis not present

## 2022-06-27 DIAGNOSIS — N763 Subacute and chronic vulvitis: Secondary | ICD-10-CM | POA: Diagnosis not present

## 2022-06-27 MED ORDER — CLOBETASOL PROP EMOLLIENT BASE 0.05 % EX CREA: TOPICAL_CREAM | CUTANEOUS | 1 refills | Status: DC

## 2022-06-27 MED ORDER — ESTRADIOL 0.1 MG/GM VA CREA: TOPICAL_CREAM | VAGINAL | 1 refills | Status: AC

## 2022-07-04 NOTE — Progress Notes (Signed)
GYNECOLOGY  VISIT   HPI: 67 y.o.   Married  Caucasian  female   [phone] with Patient's last menstrual period was 05/01/2004 (approximate).   here for  vulvar bx. Pt reported burning with clobetasol cream and wants to switch back to the clobetasol ointment.   States she has discomfort of the perineal area.   GYNECOLOGIC HISTORY: Patient's last menstrual period was 05/01/2004 (approximate). Contraception:  PMP Menopausal hormone therapy:  estradiol Last mammogram:  01/17/22 Breast Density Category B, BI-RADS CATEGORY 1 Neg  Last pap smear:   05/03/21 neg, 10/22/17 Neg:Neg HR HPV, 10-09-13 Neg:Neg HR HPV         OB History     Gravida  3   Para  3   Term  3   Preterm  0   AB  0   Living  2      SAB  0   IAB  0   Ectopic  0   Multiple  0   Live Births  3              Patient Active Problem List   Diagnosis Date Noted   Rosacea 07/18/2022   Chronic vulvitis 10/22/2017   Intra-abdominal fluid collection s/p perc drainage 03/23/2015 03/23/2015   Chronic cholecystitis with calculus s/p lap chole 03/12/2015 03/22/2015   Hypertension     Past Medical History:  Diagnosis Date   Asthma    "Exercise induced or cough variant" - very rarely use inhaler   Bile duct leak    bile drainage tube in place.   Chronic cholecystitis with calculus s/p lap chole 03/12/2015 03/22/2015   Depression    Tx'd in 1989-08-16 after a death of an infant   Dyspareunia    History of chronic cough    since gallbladder surgery-"fluid built up under right lung", "is improved"   Hypertension    Lung nodules 2017/08/16   followed by Dr.Gerhardt   Osteopenia    Pneumonia    about 10-12 years ago   Raynaud's disease    in feet   Tinnitus of right ear     Past Surgical History:  Procedure Laterality Date   BREAST SURGERY  August 16, 1992   benign cyst removed right breast   CESAREAN SECTION  1991   CHOLECYSTECTOMY     8'16 laparoscopic- bile leak in abdomen, ERD visit -drain placed in to drain about 1  liter bile fluid   COLONOSCOPY     DILATION AND CURETTAGE OF UTERUS     LAPAROSCOPIC CHOLECYSTECTOMY SINGLE SITE WITH INTRAOPERATIVE CHOLANGIOGRAM  03/12/2015   Dr Johney Maine   LUNG BIOPSY N/A 06/08/2017   Procedure: LUNG BIOPSY;  Surgeon: Grace Isaac, MD;  Location: [facility];  Service: Thoracic;  Laterality: N/A;   VIDEO BRONCHOSCOPY WITH ENDOBRONCHIAL NAVIGATION N/A 06/08/2017   Procedure: VIDEO BRONCHOSCOPY WITH ENDOBRONCHIAL NAVIGATION;  Surgeon: Grace Isaac, MD;  Location: [facility];  Service: Thoracic;  Laterality: N/A;    Current Outpatient Medications  Medication Sig Dispense Refill   acetaminophen (TYLENOL) 325 MG tablet Take 650 mg by mouth every 6 (six) hours as needed for moderate pain (FOR PAIN.).      Cholecalciferol (VITAMIN D3 PO) Take by mouth.     estradiol (ESTRACE) 0.1 MG/GM vaginal cream Use 1/2 g vaginally every night for the first 2 weeks, then use 1/2 g vaginally two or three times per week as needed to maintain symptom relief. 42.5 g 1   losartan (COZAAR) 100 MG  tablet Take 50 mg by mouth daily.      PROAIR HFA 108 (90 BASE) MCG/ACT inhaler Inhale 2 puffs into the lungs daily as needed (before exercising).      Clobetasol Prop Emollient Base 0.05 % emollient cream Use twice daily for 2 weeks for a flare.  Use at bedtime two times a week for maintenance dosing. (Patient not taking: Reported on 07/18/2022) 30 g 1   EPIPEN 2-PAK 0.3 MG/0.3ML SOAJ injection Inject 0.3 mg into the muscle once as needed (allergic reaction).  (Patient not taking: Reported on 06/27/2022)     No current facility-administered medications for this visit.     ALLERGIES: Nsaids, Bee venom, Hibiclens [chlorhexidine], and Singulair [montelukast]  Family History  Problem Relation Age of Onset   Thyroid disease Mother        hypothyroid   Hypertension Father    Parkinson's disease Father    Thyroid disease Sister        Hashimotos    Social History   Socioeconomic History   Marital status:  Married    Spouse name: Not on file   Number of children: Not on file   Years of education: Not on file   Highest education level: Not on file  Occupational History   Not on file  Tobacco Use   Smoking status: Former    Types: Cigarettes    Quit date: 05/01/1984    Years since quitting: 38.2   Smokeless tobacco: Never  Vaping Use   Vaping Use: Never used  Substance and Sexual Activity   Alcohol use: No   Drug use: No   Sexual activity: Not Currently    Partners: Male    Birth control/protection: Post-menopausal  Other Topics Concern   Not on file  Social History Narrative   Not on file   Social Determinants of Health   Financial Resource Strain: Not on file  Food Insecurity: Not on file  Transportation Needs: Not on file  Physical Activity: Not on file  Stress: Not on file  Social Connections: Not on file  Intimate Partner Violence: Not on file    Review of Systems  All other systems reviewed and are negative.   PHYSICAL EXAMINATION:    BP 126/84 (BP Location: Right Arm, Patient Position: Sitting, Cuff Size: Normal)   Pulse 75   Ht 5' 6.75" (1.695 m)   Wt 187 lb (84.8 kg)   LMP 05/01/2004 (Approximate)   SpO2 97%   BMI 29.51 kg/m     General appearance: alert, cooperative and appears stated age   Pelvic: External genitalia:  beefy erythema of the introitus.  No white raised lesions.               Urethra:  normal appearing urethra with no masses, tenderness or lesions              Bartholins and Skenes: normal            Chaperone was present for exam:  Raquel Sarna  ASSESSMENT  Chronic vulvitis.  Lichenoid dermatitis.  No biopsy needed today.   PLAN  Switch back to clobetasol ointment.  Rx given.  I encouraged used of estradiol cream per vagina and to the vulva 2 - 3 times a week when she is not using the clobetasol ointment.  Fu prn.    19 min  total time was spent for this patient encounter, including preparation, face-to-face counseling with the  patient, coordination of care, and documentation of the  encounter.

## 2022-07-18 ENCOUNTER — Ambulatory Visit: Payer: Medicare PPO | Admitting: Obstetrics and Gynecology

## 2022-07-18 ENCOUNTER — Encounter: Payer: Self-pay | Admitting: Obstetrics and Gynecology

## 2022-07-18 VITALS — BP 126/84 | HR 75 | Ht 66.75 in | Wt 187.0 lb

## 2022-07-18 DIAGNOSIS — N763 Subacute and chronic vulvitis: Secondary | ICD-10-CM

## 2022-07-18 DIAGNOSIS — L719 Rosacea, unspecified: Secondary | ICD-10-CM | POA: Insufficient documentation

## 2022-07-18 MED ORDER — CLOBETASOL PROPIONATE 0.05 % EX OINT: TOPICAL_OINTMENT | CUTANEOUS | 0 refills | Status: AC

## 2022-07-21 DIAGNOSIS — J45990 Exercise induced bronchospasm: Secondary | ICD-10-CM | POA: Diagnosis not present

## 2022-07-21 DIAGNOSIS — Z136 Encounter for screening for cardiovascular disorders: Secondary | ICD-10-CM | POA: Diagnosis not present

## 2022-07-21 DIAGNOSIS — R944 Abnormal results of kidney function studies: Secondary | ICD-10-CM | POA: Diagnosis not present

## 2022-07-21 DIAGNOSIS — Z91038 Other insect allergy status: Secondary | ICD-10-CM | POA: Diagnosis not present

## 2022-07-21 DIAGNOSIS — Z23 Encounter for immunization: Secondary | ICD-10-CM | POA: Diagnosis not present

## 2022-07-21 DIAGNOSIS — Z13220 Encounter for screening for lipoid disorders: Secondary | ICD-10-CM | POA: Diagnosis not present

## 2022-07-21 DIAGNOSIS — I10 Essential (primary) hypertension: Secondary | ICD-10-CM | POA: Diagnosis not present

## 2022-07-21 DIAGNOSIS — Z0000 Encounter for general adult medical examination without abnormal findings: Secondary | ICD-10-CM | POA: Diagnosis not present

## 2022-07-21 DIAGNOSIS — M8580 Other specified disorders of bone density and structure, unspecified site: Secondary | ICD-10-CM | POA: Diagnosis not present

## 2022-07-21 DIAGNOSIS — N1831 Chronic kidney disease, stage 3a: Secondary | ICD-10-CM | POA: Diagnosis not present

## 2022-11-13 ENCOUNTER — Encounter: Payer: Self-pay | Admitting: Obstetrics & Gynecology

## 2022-11-13 ENCOUNTER — Ambulatory Visit: Payer: Medicare PPO | Admitting: Obstetrics & Gynecology

## 2022-11-13 VITALS — BP 116/80 | HR 90

## 2022-11-13 DIAGNOSIS — R42 Dizziness and giddiness: Secondary | ICD-10-CM | POA: Diagnosis not present

## 2022-11-13 DIAGNOSIS — R4589 Other symptoms and signs involving emotional state: Secondary | ICD-10-CM | POA: Diagnosis not present

## 2022-11-13 DIAGNOSIS — L723 Sebaceous cyst: Secondary | ICD-10-CM | POA: Diagnosis not present

## 2022-11-13 NOTE — Progress Notes (Signed)
    Shelby Becker Apr 14, 1956 [id number]        67 y.o.  [id number]   RP: Left labial swelling x 2 days  HPI: Tender, swollen left labia.  Applying warm compresses.  No drainage.  No fever.  H/O Lichen Sclerosus of the vulva.  Has not used her Clobetasol ointment regularly recently.  No itching.  No vaginal discharge.  No PMB. No pelvic pain.  Abstinent.  Urine/BMs normal.   OB History  Gravida Para Term Preterm AB Living  3 3 3  0 0 2  SAB IAB Ectopic Multiple Live Births  0 0 0 0 3    # Outcome Date GA Lbr Len/2nd Weight Sex Type Anes PTL Lv  3 Term         DEC  2 Term         LIV  1 Term         LIV    Past medical history,surgical history, problem list, medications, allergies, family history and social history were all reviewed and documented in the EPIC chart.   Directed ROS with pertinent positives and negatives documented in the history of present illness/assessment and plan.  Exam:  Vitals:   11/13/22 0928  BP: 116/80  Pulse: 90  SpO2: 99%   General appearance:  Normal  Gynecologic exam: Vulva:  Left labia majora with a few sebaceous gland with visible typical thick white secretion.  No erythemal.  No drainage.  No evidence of active Lichen Sclerosus.   Assessment/Plan:  67 y.o. G3P3002   1. Sebaceous cyst  Tender, swollen left labia.  Applying warm compresses.  No drainage.  No fever.  H/O Lichen Sclerosus of the vulva.  Has not used her Clobetasol ointment regularly recently.  No itching.  No vaginal discharge.  No PMB. No pelvic pain.  Abstinent.  Urine/BMs normal. On vulvar exam today:  Left labia majora with a few sebaceous gland with visible typical thick white secretion.  No erythemal.  No drainage.  No evidence of active Lichen Sclerosus. Patient reassured.  No evidence of infection.  A few blocked Sebaceous Glands at the left labia majora.  Recommend warm Sitz baths 3 times a day to favor emptying of the glands.  Recommend not attempting to empty the glands  by squeezing them as it may cause inflammation and eventual infection.  Genia Del MD, 9:36 AM 11/13/2022

## 2022-12-08 DIAGNOSIS — H2513 Age-related nuclear cataract, bilateral: Secondary | ICD-10-CM | POA: Diagnosis not present

## 2022-12-08 DIAGNOSIS — H524 Presbyopia: Secondary | ICD-10-CM | POA: Diagnosis not present

## 2023-01-05 DIAGNOSIS — Z23 Encounter for immunization: Secondary | ICD-10-CM | POA: Diagnosis not present

## 2023-01-22 DIAGNOSIS — Z1231 Encounter for screening mammogram for malignant neoplasm of breast: Secondary | ICD-10-CM | POA: Diagnosis not present

## 2023-01-24 ENCOUNTER — Encounter: Payer: Self-pay | Admitting: Obstetrics and Gynecology

## 2023-03-09 ENCOUNTER — Emergency Department (HOSPITAL_COMMUNITY): Payer: Medicare PPO

## 2023-03-09 ENCOUNTER — Other Ambulatory Visit: Payer: Self-pay

## 2023-03-09 ENCOUNTER — Emergency Department (HOSPITAL_COMMUNITY)
Admission: EM | Admit: 2023-03-09 | Discharge: 2023-03-09 | Disposition: A | Discharge status: Home / Self Care | Payer: Medicare PPO | Attending: Emergency Medicine | Admitting: Emergency Medicine

## 2023-03-09 DIAGNOSIS — I10 Essential (primary) hypertension: Secondary | ICD-10-CM | POA: Diagnosis not present

## 2023-03-09 DIAGNOSIS — R4182 Altered mental status, unspecified: Secondary | ICD-10-CM | POA: Insufficient documentation

## 2023-03-09 LAB — BASIC METABOLIC PANEL
Anion gap: 10 (ref 5–15)
BUN: 27 mg/dL — ABNORMAL HIGH (ref 8–23)
CO2: 20 mmol/L — ABNORMAL LOW (ref 22–32)
Calcium: 9.1 mg/dL (ref 8.9–10.3)
Chloride: 106 mmol/L (ref 98–111)
Creatinine, Ser: 1.13 mg/dL — ABNORMAL HIGH (ref 0.44–1.00)
GFR, Estimated: 53 mL/min — ABNORMAL LOW (ref >60)
Glucose, Bld: 118 mg/dL — ABNORMAL HIGH (ref 70–99)
Potassium: 4 mmol/L (ref 3.5–5.1)
Sodium: 136 mmol/L (ref 135–145)

## 2023-03-09 LAB — CBC WITH DIFFERENTIAL/PLATELET
Abs Immature Granulocytes: 0.03 10*3/uL (ref 0.00–0.07)
Basophils Absolute: 0.1 10*3/uL (ref 0.0–0.1)
Basophils Relative: 1 %
Eosinophils Absolute: 0.3 10*3/uL (ref 0.0–0.5)
Eosinophils Relative: 3 %
HCT: 44.2 % (ref 36.0–46.0)
Hemoglobin: 14.7 g/dL (ref 12.0–15.0)
Immature Granulocytes: 0 %
Lymphocytes Relative: 25 %
Lymphs Abs: 2.4 10*3/uL (ref 0.7–4.0)
MCH: 30.4 pg (ref 26.0–34.0)
MCHC: 33.3 g/dL (ref 30.0–36.0)
MCV: 91.5 fL (ref 80.0–100.0)
Monocytes Absolute: 0.8 10*3/uL (ref 0.1–1.0)
Monocytes Relative: 9 %
Neutro Abs: 6.2 10*3/uL (ref 1.7–7.7)
Neutrophils Relative %: 62 %
Platelets: 166 10*3/uL (ref 150–400)
RBC: 4.83 MIL/uL (ref 3.87–5.11)
RDW: 13.9 % (ref 11.5–15.5)
WBC: 9.8 10*3/uL (ref 4.0–10.5)
nRBC: 0 % (ref 0.0–0.2)

## 2023-03-09 LAB — CBG MONITORING, ED: Glucose-Capillary: 103 mg/dL — ABNORMAL HIGH (ref 70–99)

## 2023-03-09 NOTE — ED Triage Notes (Signed)
Pt arrived via POV. C/o sudden confusion and memory loss at 1745. Pt was at dinner with daughter and husband and pt stated "where's my grandaughter?" And "who ordered this". In triaged pt stated "I dreamed that this happened"  AOx4

## 2023-03-09 NOTE — ED Provider Notes (Signed)
[facility] AT [facility] Provider Note   CSN: [id number] Arrival date & time: 03/09/23  1807     History Chief Complaint  Patient presents with   Altered Mental Status    HPI Shelby Becker is a 67 y.o. female presenting for episode of altered mental status.  I was immediately called to bedside for evaluation of a possible code stroke.  Per family at bedside approximately 1 hour prior to arrival the family was at lunch when the patient suddenly became amnestic to the events of the day.  They have been going through some relatively severe psychosocial stress today as the patient's granddaughter was coming into contact with the paternal family. They were on their way home from this event.  The patient suddenly was asking where they had been today and asking if she had been dreaming everything today.  The amnesia/repetitive speech lasted approximately 20 minutes and is resolving on patient's arrival.  Patient otherwise healthy though she does have a history of hypertension on losartan on which she is compliant. She has no other neurologic symptoms has no other complaints with clear speech at this time.   Patient's recorded medical, surgical, social, medication list and allergies were reviewed in the Snapshot window as part of the initial history.   Review of Systems   Review of Systems  Constitutional:  Negative for chills and fever.  HENT:  Negative for ear pain and sore throat.   Eyes:  Negative for pain and visual disturbance.  Respiratory:  Negative for cough and shortness of breath.   Cardiovascular:  Negative for chest pain and palpitations.  Gastrointestinal:  Negative for abdominal pain and vomiting.  Genitourinary:  Negative for dysuria and hematuria.  Musculoskeletal:  Negative for arthralgias and back pain.  Skin:  Negative for color change and rash.  Neurological:  Negative for seizures and syncope.  Psychiatric/Behavioral:  Positive for  confusion.   All other systems reviewed and are negative.   Physical Exam Updated Vital Signs BP (!) 160/99   Pulse 84   Temp 98.3 F (36.8 C) (Oral)   Resp (!) 21   Ht 5\' 7"  (1.702 m)   Wt 85 kg   LMP 05/01/2004 (Approximate) Comment: not sexually active  SpO2 96%   BMI 29.35 kg/m  Physical Exam Vitals and nursing note reviewed.  Constitutional:      General: She is not in acute distress.    Appearance: She is well-developed.  HENT:     Head: Normocephalic and atraumatic.  Eyes:     Conjunctiva/sclera: Conjunctivae normal.  Cardiovascular:     Rate and Rhythm: Normal rate and regular rhythm.     Heart sounds: No murmur heard. Pulmonary:     Effort: Pulmonary effort is normal. No respiratory distress.     Breath sounds: Normal breath sounds.  Abdominal:     General: There is no distension.     Palpations: Abdomen is soft.     Tenderness: There is no abdominal tenderness. There is no right CVA tenderness or left CVA tenderness.  Musculoskeletal:        General: No swelling or tenderness. Normal range of motion.     Cervical back: Neck supple.  Skin:    General: Skin is warm and dry.  Neurological:     General: No focal deficit present.     Mental Status: She is alert and oriented to person, place, and time. Mental status is at baseline.  Cranial Nerves: No cranial nerve deficit.     Comments: Complete neurologic exam performed.  All dermatomes with normal functioning muscle groups 5 out of 5 strength. Cranial nerves II through XII all examined with no abnormalities.  Speech is clear.  GCS 15 at this time.  No word finding difficulties or aphasia appreciated. No dysmetria on finger-nose or heel shin.      ED Course/ Medical Decision Making/ A&P Clinical Course as of 03/09/23 2325  Fri Mar 09, 2023  1945 Reassessed.  Remains at baseline no further symptoms. [CC]    Clinical Course User Index [CC] Glyn Ade, MD    Procedures Procedures    Medications Ordered in ED Medications - No data to display  Medical Decision Making:    Shelby Becker is a 67 y.o. female who presented to the ED today with episode of altered mental status detailed above.     Additional history discussed with patient's family/caregivers.  Patient placed on continuous vitals and telemetry monitoring while in ED which was reviewed periodically.   Complete initial physical exam performed, notably the patient  was hypertensive 210/90 with slight tachycardia 102.  No focal neurologic symptoms and amnesia appears to be resolved this patient was able to recount the events of the day.      Reviewed and confirmed nursing documentation for past medical history, family history, social history.    Initial Assessment:   With the patient's presentation of sudden onset altered mental status, most likely diagnosis is transient global amnesia, TIA, seizure. Other diagnoses were considered including (but not limited to) metabolic abnormality, infectious etiology, intracranial hemorrhage. These are considered less likely due to history of present illness and physical exam findings.   This is most consistent with an acute life/limb threatening illness complicated by underlying chronic conditions.  Initial Plan:  Given resolution of symptoms at this time and nonfocal nature of patient's presentation, no acute indication for activation of code stroke protocol CT head to evaluate for acute etiology of patient's symptoms. Screening labs including CBC and Metabolic panel to evaluate for infectious or metabolic etiology of disease.  Urinalysis with reflex culture ordered to evaluate for UTI or relevant urologic/nephrologic pathology. EKG to evaluate for cardiac pathology. Objective evaluation as below reviewed with plan for close reassessment  Initial Study Results:   Laboratory  All laboratory results reviewed without evidence of clinically relevant pathology.    EKG EKG  was reviewed independently. Rate, rhythm, axis, intervals all examined and without medically relevant abnormality. ST segments without concerns for elevations.    Radiology  All images reviewed independently. Agree with radiology report at this time.   CT HEAD WO CONTRAST ( )  Result Date: 03/09/2023 CLINICAL DATA:  Altered mental status. EXAM: CT HEAD WITHOUT CONTRAST TECHNIQUE: Contiguous axial images were obtained from the base of the skull through the vertex without intravenous contrast. RADIATION DOSE REDUCTION: This exam was performed according to the departmental dose-optimization program which includes automated exposure control, adjustment of the mA and/or kV according to patient size and/or use of iterative reconstruction technique. COMPARISON:  December 10, 2018 FINDINGS: Brain: No evidence of acute infarction, hemorrhage, hydrocephalus, extra-axial collection or mass lesion/mass effect. Vascular: No hyperdense vessel or unexpected calcification. Skull: Normal. Negative for fracture or focal lesion. Sinuses/Orbits: No acute finding. Other: None. IMPRESSION: No acute intracranial pathology. Electronically Signed   By: Aram Candela M.D.   On: 03/09/2023 22:19     Reassessment and Plan:   Patient's history of present  onexam findings are most consistent with nonspecific etiology.  Possibly transient global amnesia but it did resolve completely.  I recommended an MRI for further workup but unfortunately it took 4-1/2 hours to get a result on her head CT and patient does not want to stay in the emergency department for any further workup.  TIA is in the differential as well but again patient does not want any further workup.  She will follow-up in outpatient with neurology, referral placed for further workup of TIA versus other amnestic events.  Strict return precautions reinforced patient is welcome to return for further rule out of TIA/MRI if desired prior to follow-up with neurology.    Disposition:  I have considered need for hospitalization, however, considering all of the above, I believe this patient is stable for discharge at this time.  Patient/family educated about specific return precautions for given chief complaint and symptoms.  Patient/family educated about follow-up with PCP.     Patient/family expressed understanding of return precautions and need for follow-up. Patient spoken to regarding all imaging and laboratory results and appropriate follow up for these results. All education provided in verbal form with additional information in written form. Time was allowed for answering of patient questions. Patient discharged.    Emergency Department Medication Summary:   Medications - No data to display   Clinical Impression:  1. Altered mental status, unspecified altered mental status type      Discharge   Final Clinical Impression(s) / ED Diagnoses Final diagnoses:  Altered mental status, unspecified altered mental status type    Rx / DC Orders ED Discharge Orders          Ordered    Ambulatory referral to Neurology       Comments: An appointment is requested in approximately: 4 weeks   03/09/23 2237              Glyn Ade, MD 03/09/23 2325

## 2023-04-10 DIAGNOSIS — R062 Wheezing: Secondary | ICD-10-CM | POA: Diagnosis not present

## 2023-04-10 DIAGNOSIS — J189 Pneumonia, unspecified organism: Secondary | ICD-10-CM | POA: Diagnosis not present

## 2023-04-10 DIAGNOSIS — I10 Essential (primary) hypertension: Secondary | ICD-10-CM | POA: Diagnosis not present

## 2023-04-17 DIAGNOSIS — I10 Essential (primary) hypertension: Secondary | ICD-10-CM | POA: Diagnosis not present

## 2023-04-17 DIAGNOSIS — R030 Elevated blood-pressure reading, without diagnosis of hypertension: Secondary | ICD-10-CM | POA: Diagnosis not present

## 2023-04-17 DIAGNOSIS — Z8701 Personal history of pneumonia (recurrent): Secondary | ICD-10-CM | POA: Diagnosis not present

## 2023-04-17 DIAGNOSIS — Z09 Encounter for follow-up examination after completed treatment for conditions other than malignant neoplasm: Secondary | ICD-10-CM | POA: Diagnosis not present

## 2023-05-08 ENCOUNTER — Encounter: Payer: Self-pay | Admitting: Neurology

## 2023-05-08 ENCOUNTER — Ambulatory Visit: Payer: Medicare PPO | Admitting: Neurology

## 2023-05-08 VITALS — BP 185/103 | HR 92 | Ht 67.0 in | Wt 203.5 lb

## 2023-05-08 DIAGNOSIS — G43109 Migraine with aura, not intractable, without status migrainosus: Secondary | ICD-10-CM | POA: Diagnosis not present

## 2023-05-08 DIAGNOSIS — R413 Other amnesia: Secondary | ICD-10-CM

## 2023-05-08 NOTE — Progress Notes (Signed)
 Chief Complaint  Patient presents with   New Patient (Initial Visit)    Rm15, daughter meg present), NP Internal ED referral for AMS: was having a conversation about books and couldn't remember that grandaughter was dropped off already and was searching for her. It was an isolated incident. Believes stress induced seeing as daughter and husband split up and now they live with her.  Moca was 26      ASSESSMENT AND PLAN  Shelby Becker is a 68 y.o. female   Transient confusion  In the setting of extreme stress, now back to baseline, MoCA examination 28/30, denies significant memory loss  CT head in November 2024 showed no acute abnormality, discussed with patient, her 1 isolated episode of transient confusion can be related to extreme stress, not sleeping well for few days, not eating regularly the day of the event, agreed not to proceed with further imaging testing  Laboratory evaluation to rule out treatable etiology such as thyroid  functional test  DIAGNOSTIC DATA (LABS, IMAGING, TESTING) - I reviewed patient records, labs, notes, testing and imaging myself where available.   MEDICAL HISTORY:  MERON Becker is a 68 year old female, seen in request by her primary care physician Dr.   Jerral, Cyndee, for evaluation of acute mental status change, accompanied by her daughter at today's visit May 08, 2023  History is obtained from the patient and review of electronic medical records. I personally reviewed pertinent available imaging films in PACS.   PMHx of  Asthma HTN  She lives with her husband at home, daughter and her granddaughter of 66 years old moved in with her couple years ago, had a lot of stress, she used to work as a diplomatic services operational officer for [employer], later went to nursing school, stayed home take care of her family later, denied memory loss at baseline, active independent in daily activity,  She presented to emergency room March 09, 2023, for acute  mental status change, it was a very stressful day, reluctantly she accompanied her daughter to visit her ex-son-in-law in [place], it was a interruption of her routine, missing her meals, also very stressful emotionally, by the evening time, when she was having meals at restaurant, she started drinking caffeinated high sugar drink first, shortly afterwards, she was noticed to to be confused, confused about the food ordering that was placed, repeating questions, was taken to emergency room, she only partial memory of the event CT head without contrast showed no acute abnormality Laboratory showed normal CBC, BMP with mild elevation of glucose 119, creatinine of 1.13,  Since that day, she has been doing well, no difficulty handling her daily routine,  She had long history of chronic migraine headaches, recent few years, she often get visual aura, flashing light in peripheral visual field, sometimes tunnel vision, lasting for few seconds, 2 minutes, without headache, can happen couple times a week,   PHYSICAL EXAM:   Vitals:   05/08/23 0914 05/08/23 0924  BP: (!) 166/110 (!) 185/103  Pulse: 92   Weight: 203 lb 8 oz (92.3 kg)   Height: 5' 7 (1.702 m)    Not recorded     Body mass index is 31.87 kg/m.  PHYSICAL EXAMNIATION:  Gen: NAD, conversant, well nourised, well groomed                     Cardiovascular: Regular rate rhythm, no peripheral edema, warm, nontender. Eyes: Conjunctivae clear without exudates or hemorrhage Neck: Supple, no  carotid bruits. Pulmonary: Clear to auscultation bilaterally   NEUROLOGICAL EXAM:  MENTAL STATUS: Speech/cognition: Awake, alert, oriented to history taking and casual conversation    05/08/2023    9:17 AM  Montreal Cognitive Assessment   Visuospatial/ Executive (0/5) 5  Naming (0/3) 3  Attention: Read list of digits (0/2) 2  Attention: Read list of letters (0/1) 1  Attention: Serial 7 subtraction starting at 100 (0/3) 1  Language: Repeat  phrase (0/2) 2  Language : Fluency (0/1) 1  Abstraction (0/2) 2  Delayed Recall (0/5) 5  Orientation (0/6) 6  Total 28    CRANIAL NERVES: CN II: Visual fields are full to confrontation. Pupils are round equal and briskly reactive to light. CN III, IV, VI: extraocular movement are normal. No ptosis. CN V: Facial sensation is intact to light touch CN VII: Face is symmetric with normal eye closure  CN VIII: Hearing is normal to causal conversation. CN IX, X: Phonation is normal. CN XI: Head turning and shoulder shrug are intact  MOTOR: There is no pronator drift of out-stretched arms. Muscle bulk and tone are normal. Muscle strength is normal.  REFLEXES: Reflexes are 2+ and symmetric at the biceps, triceps, knees, and ankles. Plantar responses are flexor.  SENSORY: Intact to light touch, pinprick and vibratory sensation are intact in fingers and toes.  COORDINATION: There is no trunk or limb dysmetria noted.  GAIT/STANCE: Posture is normal. Gait is steady with normal steps, base, arm swing, and turning. Heel and toe walking are normal. Tandem gait is normal.  Romberg is absent.  REVIEW OF SYSTEMS:  Full 14 system review of systems performed and notable only for as above All other review of systems were negative.   ALLERGIES: Allergies  Allergen Reactions   Nsaids Other (See Comments)    Low EGFR   Bee Venom Swelling    Local swelling at site Has epipen    Hibiclens  [Chlorhexidine ]    Singulair [Montelukast] Other (See Comments)    HOME MEDICATIONS: Current Outpatient Medications  Medication Sig Dispense Refill   acetaminophen  (TYLENOL ) 325 MG tablet Take 650 mg by mouth every 6 (six) hours as needed for moderate pain (FOR PAIN.).      clobetasol  ointment (TEMOVATE ) 0.05 % Place on the affected area twice a day for 2 weeks as needed for a flare and use twice a week at bedtime for maintenance dosing. 60 g 0   EPIPEN  2-PAK 0.3 MG/0.3ML SOAJ injection Inject 0.3 mg into  the muscle once as needed (allergic reaction).     estradiol  (ESTRACE ) 0.1 MG/GM vaginal cream Use 1/2 g vaginally every night for the first 2 weeks, then use 1/2 g vaginally two or three times per week as needed to maintain symptom relief. 42.5 g 1   losartan (COZAAR) 100 MG tablet Take 100 mg by mouth daily.     PROAIR  HFA 108 (90 BASE) MCG/ACT inhaler Inhale 2 puffs into the lungs daily as needed (before exercising).      No current facility-administered medications for this visit.    PAST MEDICAL HISTORY: Past Medical History:  Diagnosis Date   Asthma    Exercise induced or cough variant - very rarely use inhaler   Bile duct leak    bile drainage tube in place.   Chronic cholecystitis with calculus s/p lap chole 03/12/2015 03/22/2015   Depression    Tx'd in 04/21/90 after a death of an infant   Dyspareunia    History of chronic cough  since gallbladder surgery-fluid built up under right lung, is improved   Hypertension    Lung nodules 06/2017   followed by Dr.Gerhardt   Osteopenia    Pneumonia    about 10-12 years ago   Raynaud's disease    in feet   Tinnitus of right ear     PAST SURGICAL HISTORY: Past Surgical History:  Procedure Laterality Date   BREAST SURGERY  1994   benign cyst removed right breast   CESAREAN SECTION  1991   CHOLECYSTECTOMY     8'16 laparoscopic- bile leak in abdomen, ERD visit -drain placed in to drain about 1 liter bile fluid   COLONOSCOPY     DILATION AND CURETTAGE OF UTERUS     LAPAROSCOPIC CHOLECYSTECTOMY SINGLE SITE WITH INTRAOPERATIVE CHOLANGIOGRAM  03/12/2015   Dr Sheldon   LUNG BIOPSY N/A 06/08/2017   Procedure: LUNG BIOPSY;  Surgeon: Army Dallas NOVAK, MD;  Location: [facility] OR;  Service: Thoracic;  Laterality: N/A;   VIDEO BRONCHOSCOPY WITH ENDOBRONCHIAL NAVIGATION N/A 06/08/2017   Procedure: VIDEO BRONCHOSCOPY WITH ENDOBRONCHIAL NAVIGATION;  Surgeon: Army Dallas NOVAK, MD;  Location: [facility] OR;  Service: Thoracic;  Laterality: N/A;     FAMILY HISTORY: Family History  Problem Relation Age of Onset   Thyroid  disease Mother        hypothyroid   Hypertension Father    Parkinson's disease Father    Thyroid  disease Sister        Hashimotos    SOCIAL HISTORY: Social History   Socioeconomic History   Marital status: Married    Spouse name: Not on file   Number of children: Not on file   Years of education: Not on file   Highest education level: Not on file  Occupational History   Not on file  Tobacco Use   Smoking status: Former    Current packs/day: 0.00    Types: Cigarettes    Quit date: 05/01/1984    Years since quitting: 39.0   Smokeless tobacco: Never  Vaping Use   Vaping status: Never Used  Substance and Sexual Activity   Alcohol use: No   Drug use: No   Sexual activity: Not Currently    Partners: Male    Birth control/protection: Post-menopausal  Other Topics Concern   Not on file  Social History Narrative   Not on file   Social Drivers of Health   Financial Resource Strain: Not on file  Food Insecurity: Not on file  Transportation Needs: Not on file  Physical Activity: Not on file  Stress: Not on file  Social Connections: Not on file  Intimate Partner Violence: Not on file      Modena Callander, M.D. Ph.D.  [facility] [address] [place], [place] [address] Ph: [phone] Fax: [phone]  CC:  Jerral Meth, MD [address] [place],  [place] [address]  Chrystal Lamarr RAMAN, MD

## 2023-05-09 LAB — VITAMIN B12: Vitamin B-12: 445 pg/mL (ref 232–1245)

## 2023-05-09 LAB — TSH: TSH: 2.12 u[IU]/mL (ref 0.450–4.500)

## 2023-05-09 LAB — C-REACTIVE PROTEIN: CRP: 5 mg/L (ref 0–10)

## 2023-05-09 LAB — SEDIMENTATION RATE: Sed Rate: 33 mm/h (ref 0–40)

## 2023-05-09 LAB — ANA W/REFLEX IF POSITIVE: Anti Nuclear Antibody (ANA): NEGATIVE

## 2023-05-10 ENCOUNTER — Telehealth: Payer: Self-pay

## 2023-08-06 DIAGNOSIS — Z0000 Encounter for general adult medical examination without abnormal findings: Secondary | ICD-10-CM | POA: Diagnosis not present

## 2023-08-06 DIAGNOSIS — Z13220 Encounter for screening for lipoid disorders: Secondary | ICD-10-CM | POA: Diagnosis not present

## 2023-08-06 DIAGNOSIS — M8580 Other specified disorders of bone density and structure, unspecified site: Secondary | ICD-10-CM | POA: Diagnosis not present

## 2023-08-06 DIAGNOSIS — Z91038 Other insect allergy status: Secondary | ICD-10-CM | POA: Diagnosis not present

## 2023-08-06 DIAGNOSIS — Z136 Encounter for screening for cardiovascular disorders: Secondary | ICD-10-CM | POA: Diagnosis not present

## 2023-08-06 DIAGNOSIS — J45990 Exercise induced bronchospasm: Secondary | ICD-10-CM | POA: Diagnosis not present

## 2023-08-06 DIAGNOSIS — Z79899 Other long term (current) drug therapy: Secondary | ICD-10-CM | POA: Diagnosis not present

## 2023-08-06 DIAGNOSIS — I10 Essential (primary) hypertension: Secondary | ICD-10-CM | POA: Diagnosis not present

## 2023-08-06 DIAGNOSIS — N1831 Chronic kidney disease, stage 3a: Secondary | ICD-10-CM | POA: Diagnosis not present

## 2023-08-30 DIAGNOSIS — L820 Inflamed seborrheic keratosis: Secondary | ICD-10-CM | POA: Diagnosis not present

## 2023-08-30 DIAGNOSIS — L821 Other seborrheic keratosis: Secondary | ICD-10-CM | POA: Diagnosis not present

## 2023-08-30 DIAGNOSIS — L719 Rosacea, unspecified: Secondary | ICD-10-CM | POA: Diagnosis not present

## 2023-08-30 DIAGNOSIS — D1801 Hemangioma of skin and subcutaneous tissue: Secondary | ICD-10-CM | POA: Diagnosis not present

## 2023-08-30 DIAGNOSIS — D225 Melanocytic nevi of trunk: Secondary | ICD-10-CM | POA: Diagnosis not present

## 2023-08-30 DIAGNOSIS — L814 Other melanin hyperpigmentation: Secondary | ICD-10-CM | POA: Diagnosis not present

## 2023-08-30 DIAGNOSIS — D485 Neoplasm of uncertain behavior of skin: Secondary | ICD-10-CM | POA: Diagnosis not present

## 2023-08-30 DIAGNOSIS — Z86018 Personal history of other benign neoplasm: Secondary | ICD-10-CM | POA: Diagnosis not present

## 2023-09-05 DIAGNOSIS — I10 Essential (primary) hypertension: Secondary | ICD-10-CM | POA: Diagnosis not present

## 2023-09-05 DIAGNOSIS — N1831 Chronic kidney disease, stage 3a: Secondary | ICD-10-CM | POA: Diagnosis not present

## 2023-09-26 DIAGNOSIS — H6122 Impacted cerumen, left ear: Secondary | ICD-10-CM | POA: Diagnosis not present

## 2023-10-13 DIAGNOSIS — J029 Acute pharyngitis, unspecified: Secondary | ICD-10-CM | POA: Diagnosis not present

## 2023-10-13 DIAGNOSIS — R051 Acute cough: Secondary | ICD-10-CM | POA: Diagnosis not present

## 2023-12-14 DIAGNOSIS — H35373 Puckering of macula, bilateral: Secondary | ICD-10-CM | POA: Diagnosis not present

## 2023-12-14 DIAGNOSIS — H2513 Age-related nuclear cataract, bilateral: Secondary | ICD-10-CM | POA: Diagnosis not present

## 2023-12-14 DIAGNOSIS — H524 Presbyopia: Secondary | ICD-10-CM | POA: Diagnosis not present

## 2024-01-28 DIAGNOSIS — Z1231 Encounter for screening mammogram for malignant neoplasm of breast: Secondary | ICD-10-CM | POA: Diagnosis not present

## 2024-02-06 DIAGNOSIS — N6001 Solitary cyst of right breast: Secondary | ICD-10-CM | POA: Diagnosis not present

## 2024-02-06 DIAGNOSIS — R928 Other abnormal and inconclusive findings on diagnostic imaging of breast: Secondary | ICD-10-CM | POA: Diagnosis not present

## 2024-02-07 DIAGNOSIS — L820 Inflamed seborrheic keratosis: Secondary | ICD-10-CM | POA: Diagnosis not present

## 2024-02-07 DIAGNOSIS — Z411 Encounter for cosmetic surgery: Secondary | ICD-10-CM | POA: Diagnosis not present

## 2024-02-07 DIAGNOSIS — L719 Rosacea, unspecified: Secondary | ICD-10-CM | POA: Diagnosis not present

## 2024-03-11 DIAGNOSIS — H61303 Acquired stenosis of external ear canal, unspecified, bilateral: Secondary | ICD-10-CM | POA: Diagnosis not present

## 2024-03-11 DIAGNOSIS — H6123 Impacted cerumen, bilateral: Secondary | ICD-10-CM | POA: Diagnosis not present
